# Patient Record
Sex: Male | Born: 1975 | Race: White | Hispanic: Yes | Smoking: Former smoker
Health system: Southern US, Community
[De-identification: ages and names within clinical notes are randomized; demographics above are authoritative.]

## PROBLEM LIST (undated history)

## (undated) DIAGNOSIS — J45909 Unspecified asthma, uncomplicated: Secondary | ICD-10-CM

## (undated) HISTORY — PX: CHOLECYSTECTOMY: SHX55

---

## 1999-12-12 ENCOUNTER — Emergency Department (HOSPITAL_COMMUNITY): Admission: EM | Admit: 1999-12-12 | Discharge: 1999-12-12 | Payer: Self-pay | Admitting: Emergency Medicine

## 1999-12-12 ENCOUNTER — Encounter: Payer: Self-pay | Admitting: Emergency Medicine

## 2004-01-26 ENCOUNTER — Emergency Department (HOSPITAL_COMMUNITY): Admission: EM | Admit: 2004-01-26 | Discharge: 2004-01-26 | Payer: Self-pay | Admitting: Family Medicine

## 2004-09-23 ENCOUNTER — Emergency Department (HOSPITAL_COMMUNITY): Admission: EM | Admit: 2004-09-23 | Discharge: 2004-09-23 | Payer: Self-pay | Admitting: Family Medicine

## 2005-03-07 ENCOUNTER — Emergency Department (HOSPITAL_COMMUNITY): Admission: EM | Admit: 2005-03-07 | Discharge: 2005-03-07 | Payer: Self-pay | Admitting: Family Medicine

## 2005-10-08 ENCOUNTER — Emergency Department (HOSPITAL_COMMUNITY): Admission: EM | Admit: 2005-10-08 | Discharge: 2005-10-08 | Payer: Self-pay | Admitting: Emergency Medicine

## 2005-11-17 ENCOUNTER — Encounter (INDEPENDENT_AMBULATORY_CARE_PROVIDER_SITE_OTHER): Payer: Self-pay | Admitting: *Deleted

## 2005-11-17 ENCOUNTER — Inpatient Hospital Stay (HOSPITAL_COMMUNITY): Admission: EM | Admit: 2005-11-17 | Discharge: 2005-11-19 | Payer: Self-pay | Admitting: Emergency Medicine

## 2005-11-21 ENCOUNTER — Ambulatory Visit: Payer: Self-pay | Admitting: Nurse Practitioner

## 2005-11-22 ENCOUNTER — Encounter: Payer: Self-pay | Admitting: Emergency Medicine

## 2005-11-23 ENCOUNTER — Inpatient Hospital Stay (HOSPITAL_COMMUNITY): Admission: AD | Admit: 2005-11-23 | Discharge: 2005-11-25 | Payer: Self-pay

## 2005-12-09 ENCOUNTER — Inpatient Hospital Stay (HOSPITAL_COMMUNITY): Admission: RE | Admit: 2005-12-09 | Discharge: 2005-12-12 | Payer: Self-pay

## 2006-04-15 ENCOUNTER — Ambulatory Visit: Payer: Self-pay | Admitting: Nurse Practitioner

## 2006-04-16 ENCOUNTER — Ambulatory Visit: Payer: Self-pay | Admitting: *Deleted

## 2006-10-17 ENCOUNTER — Ambulatory Visit: Payer: Self-pay | Admitting: Family Medicine

## 2006-11-12 ENCOUNTER — Encounter (INDEPENDENT_AMBULATORY_CARE_PROVIDER_SITE_OTHER): Payer: Self-pay | Admitting: *Deleted

## 2007-02-16 ENCOUNTER — Emergency Department (HOSPITAL_COMMUNITY): Admission: EM | Admit: 2007-02-16 | Discharge: 2007-02-16 | Payer: Self-pay | Admitting: Emergency Medicine

## 2007-03-20 ENCOUNTER — Ambulatory Visit: Payer: Self-pay | Admitting: Internal Medicine

## 2007-03-21 ENCOUNTER — Ambulatory Visit (HOSPITAL_COMMUNITY): Admission: RE | Admit: 2007-03-21 | Discharge: 2007-03-21 | Payer: Self-pay | Admitting: Nurse Practitioner

## 2007-03-23 ENCOUNTER — Encounter (INDEPENDENT_AMBULATORY_CARE_PROVIDER_SITE_OTHER): Payer: Self-pay | Admitting: Internal Medicine

## 2007-03-23 ENCOUNTER — Ambulatory Visit: Payer: Self-pay | Admitting: Family Medicine

## 2007-03-23 LAB — CONVERTED CEMR LAB
AST: 27 units/L (ref 0–37)
Alkaline Phosphatase: 82 units/L (ref 39–117)
Amphetamine Screen, Ur: NEGATIVE
BUN: 14 mg/dL (ref 6–23)
Basophils Absolute: 0 10*3/uL (ref 0.0–0.1)
Basophils Relative: 1 % (ref 0–1)
Benzodiazepines.: NEGATIVE
Cocaine Metabolites: NEGATIVE
Creatinine, Ser: 0.79 mg/dL (ref 0.40–1.50)
Eosinophils Relative: 2 % (ref 0–5)
HCT: 46.4 % (ref 39.0–52.0)
Hemoglobin: 16 g/dL (ref 13.0–17.0)
MCHC: 34.5 g/dL (ref 30.0–36.0)
Methadone: NEGATIVE
Monocytes Absolute: 0.5 10*3/uL (ref 0.1–1.0)
Phencyclidine (PCP): NEGATIVE
Platelets: 304 10*3/uL (ref 150–400)
RDW: 12.8 % (ref 11.5–15.5)
Sed Rate: 2 mm/hr (ref 0–16)
Total Bilirubin: 0.7 mg/dL (ref 0.3–1.2)

## 2007-04-17 ENCOUNTER — Ambulatory Visit: Payer: Self-pay | Admitting: Internal Medicine

## 2007-04-20 ENCOUNTER — Ambulatory Visit (HOSPITAL_COMMUNITY): Admission: RE | Admit: 2007-04-20 | Discharge: 2007-04-20 | Payer: Self-pay | Admitting: Family Medicine

## 2007-05-19 ENCOUNTER — Encounter: Admission: RE | Admit: 2007-05-19 | Discharge: 2007-06-03 | Payer: Self-pay | Admitting: Family Medicine

## 2007-07-02 ENCOUNTER — Emergency Department (HOSPITAL_COMMUNITY): Admission: EM | Admit: 2007-07-02 | Discharge: 2007-07-02 | Payer: Self-pay | Admitting: Emergency Medicine

## 2007-07-15 ENCOUNTER — Ambulatory Visit: Payer: Self-pay | Admitting: Internal Medicine

## 2007-07-15 ENCOUNTER — Ambulatory Visit (HOSPITAL_COMMUNITY): Admission: RE | Admit: 2007-07-15 | Discharge: 2007-07-15 | Payer: Self-pay | Admitting: Family Medicine

## 2007-07-23 ENCOUNTER — Ambulatory Visit: Payer: Self-pay | Admitting: Internal Medicine

## 2007-08-04 IMAGING — CT CT ABDOMEN W/ CM
1 of 3 series · 14 of 32 positions shown, 19 images · IV contrast (omnipaque)
Comparison: none

CLINICAL DATA: 30 year-old male status post cholecystectomy 11/18/05 with recurrent right upper quadrant pain, fever, and vomiting.  
ABDOMEN CT WITH CONTRAST:
TECHNIQUE: Multidetector CT imaging of the abdomen was performed following the standard protocol during bolus administration of intravenous contrast.
Contrast:  125 cc Omnipaque 300
TECHNIQUE: Multidetector CT imaging of the pelvis was performed following the standard protocol during bolus administration of intravenous contrast.
No acute fluid collection, adenopathy, or bowel abnormality.  Small amount of dependent free fluid is present, image # 88.

[Series 2: abd_pel 5.0 b40f st · axial · 0.72mm/px · z∈[+694,+1174]mm · 14 of 108 slices shown, 19 images]
[im 6/108  soft-tissue]
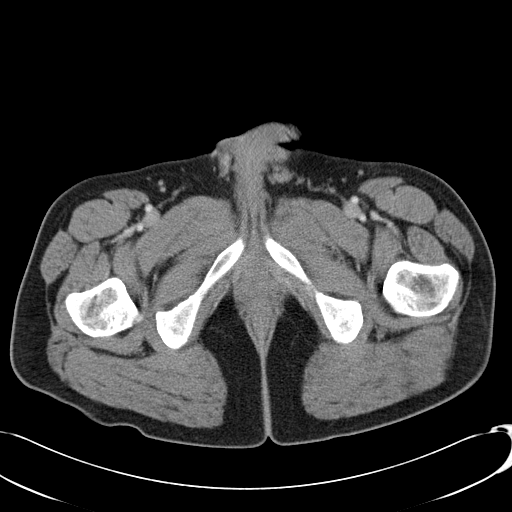
[im 6/108  bone]
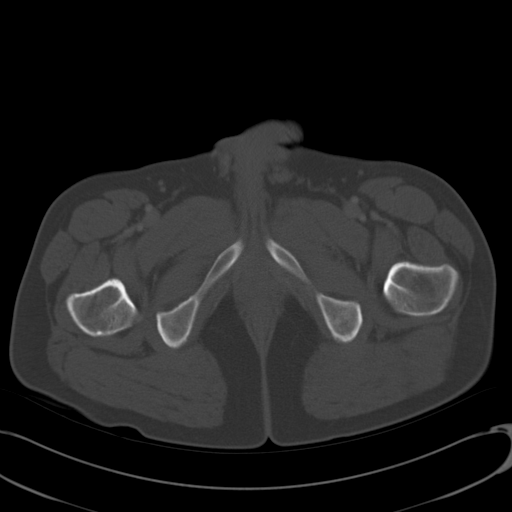
[im 17/108  soft-tissue]
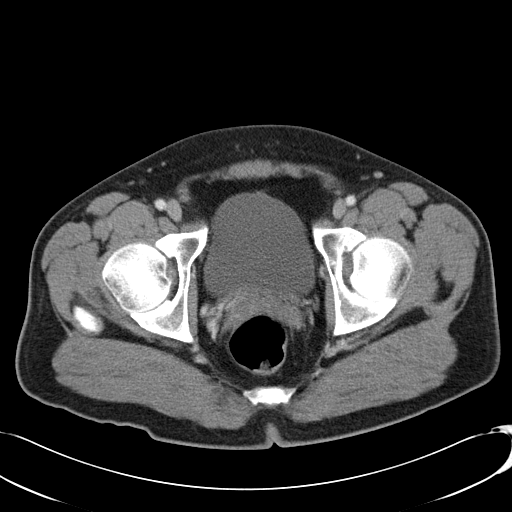
[im 22/108  soft-tissue]
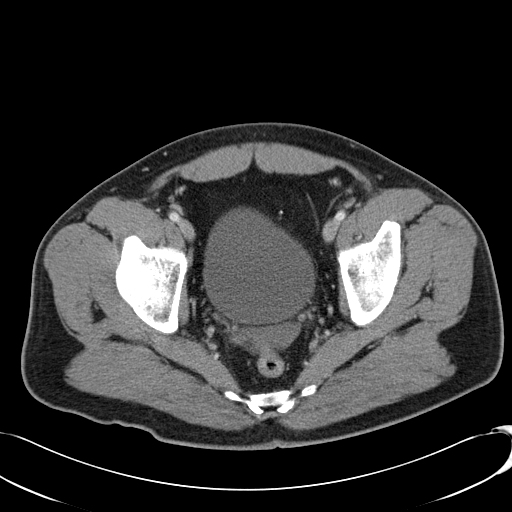
[im 33/108  soft-tissue]
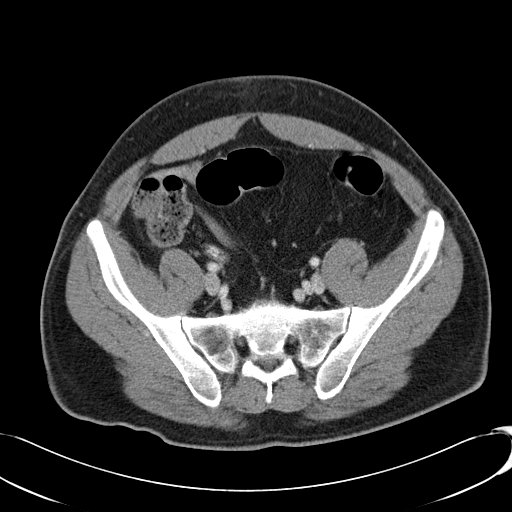
[im 38/108  soft-tissue]
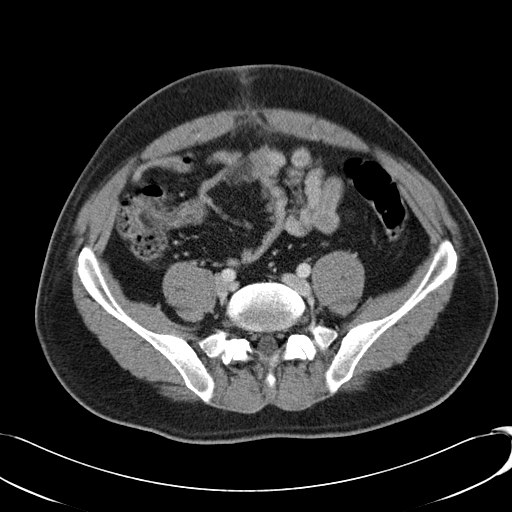
[im 49/108  soft-tissue]
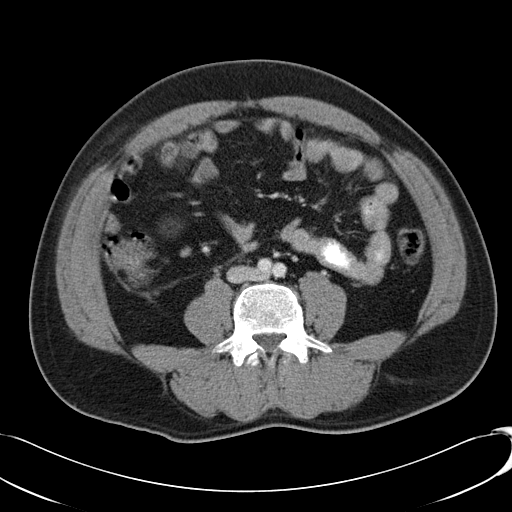
[im 54/108  soft-tissue]
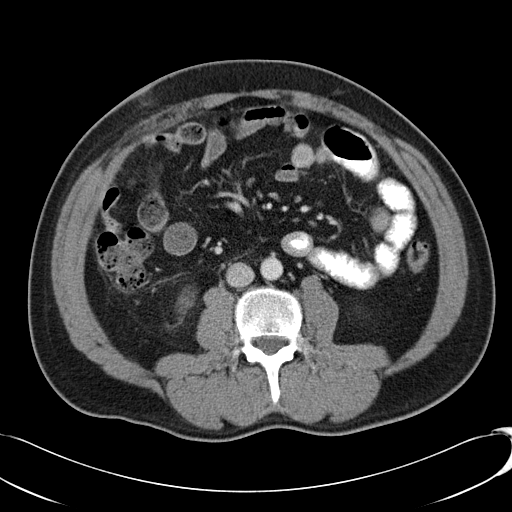
[im 59/108  soft-tissue]
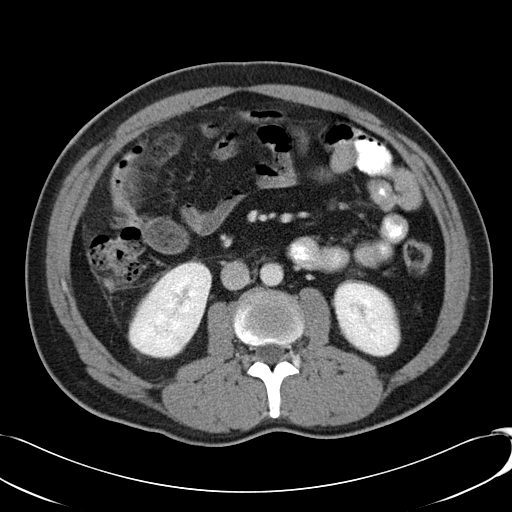
[im 70/108  soft-tissue]
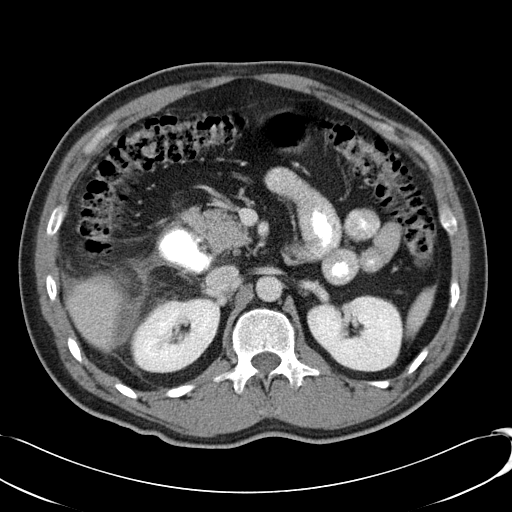
[im 70/108  bone]
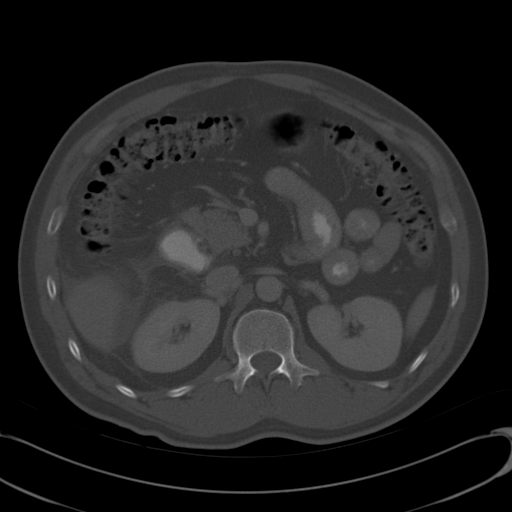
[im 75/108  soft-tissue]
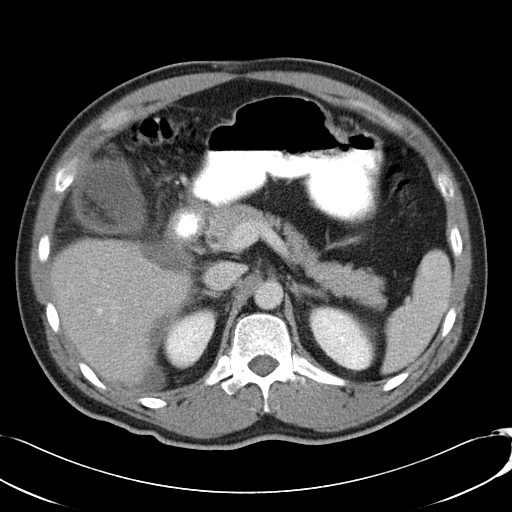
[im 86/108  soft-tissue]
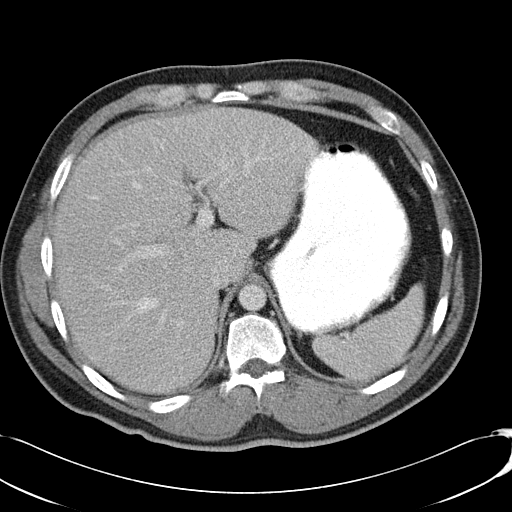
[im 86/108  lung]
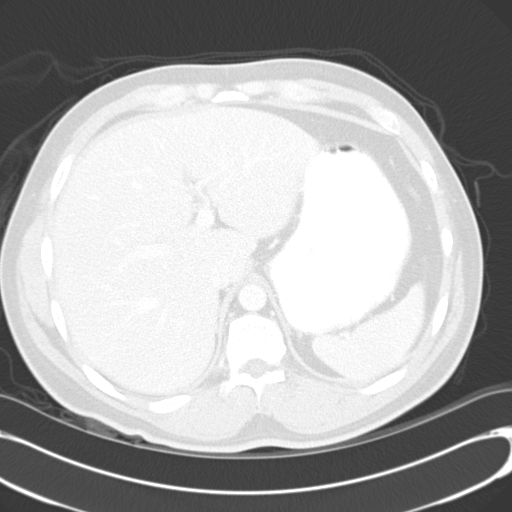
[im 91/108  soft-tissue]
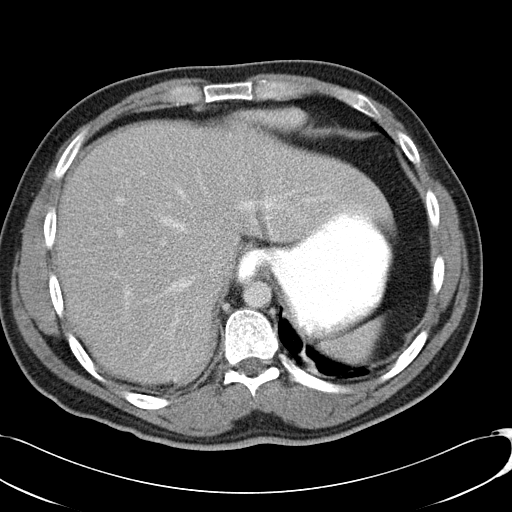
[im 91/108  lung]
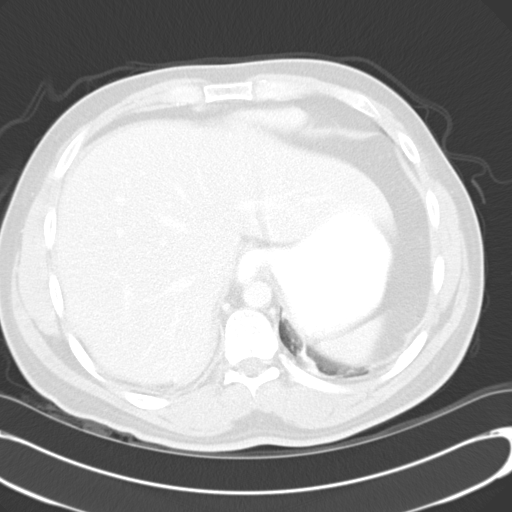
[im 97/108  lung]
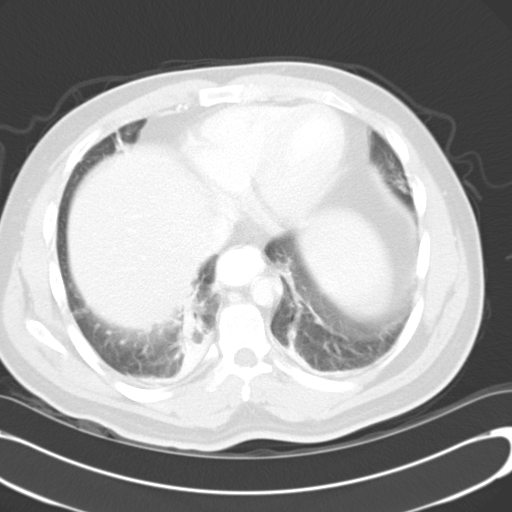
[im 102/108  soft-tissue]
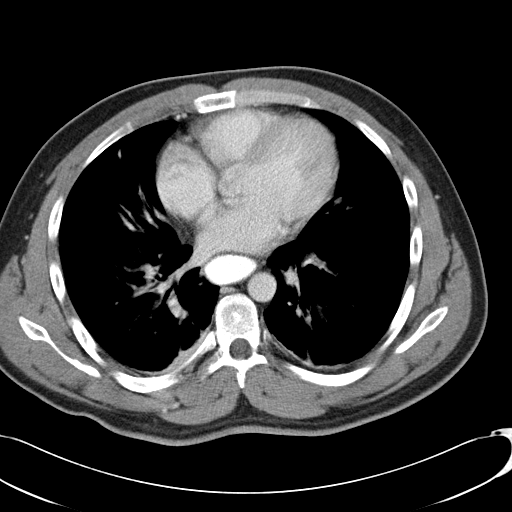
[im 102/108  lung]
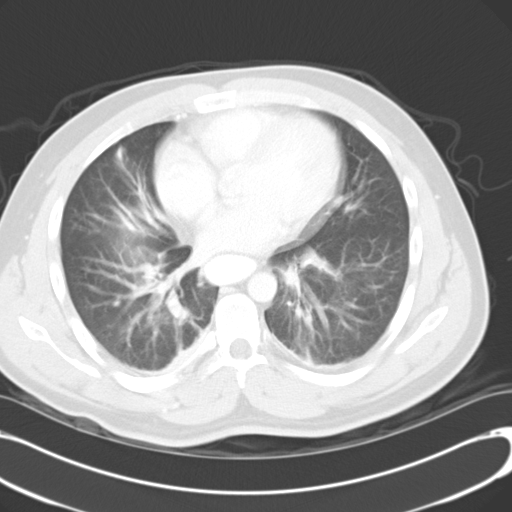

[14 of 32 positions shown; findings below may reference images not displayed]

FINDINGS: Bibasilar atelectasis is evident, worse on the right.  No pleural or pericardial fluid.  Contrast is present within a distended distal esophagus with an air-fluid level suspicious for esophageal dysmotility.
Within the abdomen, postoperative changes are present from a cholecystectomy.  In the gallbladder fossa, there is an irregular fluid collection with foci of gas and peripheral enhancement.  Surrounding inflammation is evident.  This is concerning for abscess formation or biloma.  The gallbladder fossa collection measures 6.3 x 8.0cm.  A small amount of perihepatic free fluid extends along the inferior dependent portion of the liver.  Inflammation and thickening is evident of the adjacent duodenum.  No biliary dilatation within the liver.  The liver, kidneys, adrenal glands, spleen, and pancreas are normal.  No bowel obstruction, dilatation, free air, or adenopathy.
IMPRESSION: 1.  6 x 8cm gallbladder fossa air-fluid collection consistent with postoperative abscess or biloma.  
2.  No biliary dilatation.  
3.  Small amount of inferior dependent perihepatic free fluid.
PELVIS CT WITH CONTRAST:
IMPRESSION: Trace pelvic free fluid.  No other acute pelvic finding.

## 2007-08-07 ENCOUNTER — Ambulatory Visit: Payer: Self-pay | Admitting: Internal Medicine

## 2008-04-07 ENCOUNTER — Ambulatory Visit: Payer: Self-pay | Admitting: Internal Medicine

## 2008-04-15 ENCOUNTER — Encounter (INDEPENDENT_AMBULATORY_CARE_PROVIDER_SITE_OTHER): Payer: Self-pay | Admitting: Internal Medicine

## 2008-04-15 ENCOUNTER — Ambulatory Visit: Payer: Self-pay | Admitting: Internal Medicine

## 2008-04-15 LAB — CONVERTED CEMR LAB
Albumin: 4.8 g/dL (ref 3.5–5.2)
Alkaline Phosphatase: 74 units/L (ref 39–117)
BUN: 17 mg/dL (ref 6–23)
Calcium: 10.1 mg/dL (ref 8.4–10.5)
Chloride: 108 meq/L (ref 96–112)
Eosinophils Absolute: 0.4 10*3/uL (ref 0.0–0.7)
Glucose, Bld: 107 mg/dL — ABNORMAL HIGH (ref 70–99)
HCT: 45.4 % (ref 39.0–52.0)
Hemoglobin: 15.9 g/dL (ref 13.0–17.0)
Lymphs Abs: 2.6 10*3/uL (ref 0.7–4.0)
MCV: 93.8 fL (ref 78.0–100.0)
Monocytes Absolute: 0.7 10*3/uL (ref 0.1–1.0)
Monocytes Relative: 8 % (ref 3–12)
Neutrophils Relative %: 55 % (ref 43–77)
Potassium: 4.2 meq/L (ref 3.5–5.3)
RBC: 4.84 M/uL (ref 4.22–5.81)
WBC: 8.4 10*3/uL (ref 4.0–10.5)

## 2008-04-16 ENCOUNTER — Encounter (INDEPENDENT_AMBULATORY_CARE_PROVIDER_SITE_OTHER): Payer: Self-pay | Admitting: Internal Medicine

## 2008-05-12 ENCOUNTER — Ambulatory Visit: Payer: Self-pay | Admitting: Internal Medicine

## 2008-05-12 ENCOUNTER — Encounter (INDEPENDENT_AMBULATORY_CARE_PROVIDER_SITE_OTHER): Payer: Self-pay | Admitting: Internal Medicine

## 2008-05-12 LAB — CONVERTED CEMR LAB
Barbiturate Quant, Ur: NEGATIVE
Benzodiazepines.: NEGATIVE
Creatinine,U: 216.8 mg/dL
GC Probe Amp, Urine: NEGATIVE
Opiate Screen, Urine: NEGATIVE

## 2008-06-20 ENCOUNTER — Ambulatory Visit: Payer: Self-pay | Admitting: Family Medicine

## 2010-07-13 NOTE — Op Note (Signed)
[name], [name]                ACCOUNT NO.:  [id number]   MEDICAL RECORD NO.:  [id number]          PATIENT TYPE:  INP   LOCATION:  1608                         FACILITY:  [facility]   PHYSICIAN:  Lebron Conners, M.D.   DATE OF BIRTH:  16-May-1975   DATE OF PROCEDURE:  11/18/2005  DATE OF DISCHARGE:                                 OPERATIVE REPORT   PREOPERATIVE DIAGNOSES:  Cholelithiasis and acute cholecystitis.   POSTOPERATIVE DIAGNOSES:  Cholelithiasis and acute cholecystitis.   OPERATION:  Laparoscopic cholecystectomy.   SURGEON:  Lebron Conners, M.D.   ANESTHESIA:  General and local.   COMPLICATIONS:  None.   BLOOD LOSS:  About 100 mL.   SPECIMEN:  Gallbladder.   PROCEDURE:  After the patient was monitored and asleep and had routine  preparation and draping of the abdomen, I made a 3 cm transverse incision  just below the umbilicus where I had infiltrated local anesthetic.  I opened  the fascia longitudinally for about 2 cm and bluntly opened the peritoneal  cavity and then put in a 0 Vicryl pursestring suture in the fascia and  secured a Hasson cannula.  After inflation of the abdomen with carbon  dioxide, I put in 3 additional ports through anesthetized sites and pulled  down an adhesion of the omentum to the undersurface of the gallbladder and  saw that the gallbladder was acutely inflamed, thick-walled and distended.  I decompressed it with a suction aspirator and then grasped the fundus and  elevated it toward the right shoulder.  I worked with the patient positioned  head up, foot down, and tilted to the left.  A 30-degree viewing scope was  necessary in order to see the infundibulum.  I was able to identify the  infundibulum of the gallbladder and grasp it and pull it laterally.  The  hepatoduodenal ligament was quite fatty and quite inflamed, and there was a  large stone in the infundibulum making retraction and dissection somewhat  difficult.  However, after  persistent dissection I was able to identify the  cystic duct as it emerged from the gallbladder, and I clipped it with 4  clips and cut between the two which were closest to the gallbladder.  I then  further dissected the hepatoduodenal ligament and noted the cystic artery  branching onto the gallbladder, and I clipped and divided that as well.  I  then used the cautery to dissect the gallbladder out of the liver, gaining  hemostasis with the cautery as I did the dissection.  As I dissected the  gallbladder, it tore at the site of the grasper and a large stone came out.  I retrieved the stone and placed it in a plastic pouch and reserved it above  the liver.  After detaching the gallbladder from the liver, I placed it in a  plastic pouch and placed the plastic pouch containing the stone in there as  well.  I then copiously irrigated the right upper quadrant and removed stone  fragments and bile and blood and then carefully got good hemostasis in the  gallbladder  fossa.  Even though I felt hemostasis was good, I packed the  gallbladder fossa with Surgicel.  I removed the gallbladder from the abdomen  through the umbilical incision and tied the pursestring suture.  That gave  adequate closure of the umbilical incision.  I removed the remaining irrigant from the gutter lateral to the liver and  then removed the two lateral ports under direct view.  I removed the  epigastric port after allowing the carbon dioxide to escape, and I then  closed all skin incisions with intracuticular 4-0 Vicryl and Steri-Strips.  The patient tolerated the operation well.      Lebron Conners, M.D.  Electronically Signed     WB/MEDQ  D:  11/18/2005  T:  11/20/2005  Job:  [id number]

## 2010-07-13 NOTE — Discharge Summary (Signed)
NAMEGRIFFIN, [name]                ACCOUNT NO.:  [id number]   MEDICAL RECORD NO.:  [id number]          PATIENT TYPE:  INP   LOCATION:  5731                         FACILITY:  [facility]   PHYSICIAN:  Lebron Conners, M.D.   DATE OF BIRTH:  Dec 05, 1975   DATE OF ADMISSION:  12/09/2005  DATE OF DISCHARGE:  12/12/2005                                 DISCHARGE SUMMARY   HISTORY:  This man had laparoscopic cholecystectomy a short while back, and  it has been complicated by an abdominal abscess.  He is admitted to the  hospital on December 09, 2005, with plans for IV antibiotics and percutaneous  drainage.  The abscess is shown clearly by imaging.   HOSPITAL COURSE:  On December 10, 2005, he underwent right upper quadrant  abscess drainage with replacement of the existing catheter.  He did well.  His white count was normal, and his temperature remained normal.  On October  18, he felt much better and was eating well.  White count was normal.  Abdomen soft.  Cultures growing staph aureus with unreported sensitivities.  He was sent home on Keflex since he had responded to IV Ancef.  We would  plan to follow up the culture and see him back in the office in a few days.   DIAGNOSIS:  Postoperative abdominal abscess.   OPERATION:  Percutaneous drainage of abscess.   DISCHARGE CONDITION:  Improved.      Lebron Conners, M.D.  Electronically Signed     WB/MEDQ  D:  01/15/2006  T:  01/15/2006  Job:  [id number]

## 2010-07-13 NOTE — H&P (Signed)
NAMEJACEK, [name]                ACCOUNT NO.:  [id number]   MEDICAL RECORD NO.:  [id number]          PATIENT TYPE:  INP   LOCATION:  5734                         FACILITY:  [facility]   PHYSICIAN:  Thomas A. Cornett, M.D.DATE OF BIRTH:  13-Aug-1975   DATE OF ADMISSION:  11/23/2005  DATE OF DISCHARGE:                                HISTORY & PHYSICAL   CHIEF COMPLAINT:  Right upper quadrant pain status post laparoscopic  cholecystectomy.   HISTORY OF PRESENT ILLNESS:  The patient is a 35 year old male who is now 3  days status post laparoscopic cholecystectomy bus Dr. Lebron Conners for  acute cholecystitis and cholelithiasis.  He presents with a 1-day history of  right upper quadrant pain, nausea, vomiting, fever and chills.  He was  brought to the emergency room today for evaluation.  CT scan revealed an  abscess in his right upper quadrant and the gallbladder fossa.  I was asked  to see him for this.  He is complaining of mild to moderate pain in his  right upper quadrant with nausea, vomiting, and anorexia.  Denies any back  pain or shortness of breath.   PAST MEDICAL HISTORY:  As above.   PAST SURGICAL HISTORY:  As above.   FAMILY HISTORY:  Noncontributory.   SOCIAL HISTORY:  Positive for tobacco use.  No alcohol use.   ALLERGIES:  NO KNOWN DRUG ALLERGIES.   MEDICATIONS:  1. Percocet.  2. Cipro.   REVIEW OF SYSTEMS:  Otherwise negative.   PHYSICAL EXAMINATION:  VITAL SIGNS:  Temperature is 98.3, pulse 89, blood  pressure 142/80.  GENERAL APPEARANCE:  Hispanic male in no apparent stress distress.  HEENT:  No scleral icterus.  Oropharynx clear.  NECK:  Supple, nontender.  CHEST:  Clear to auscultation.  CARDIOVASCULAR:  Regular rate and rhythm.  ABDOMEN:  Tender in the right upper quadrant with rebound and guarding  noted.  Incisions are clean, dry, intact.  EXTREMITIES:  No edema.   DIAGNOSTIC STUDIES:  CT scan reveals an abscess and gallbladder fossa with  air and  fluid measuring 7 cm in maximal diameter.  He has a white count of  13,400 with a left shift.  Hemoglobin is 15.  Electrolytes are within normal  limits except for a glucose of 130.  His liver functions show an alkaline  phosphatase of 194, total bilirubin 0.9, AST and ALT 79 and 141.   IMPRESSION:  Postoperative abscess, gallbladder fossa, status post  laparoscopic cholecystectomy.   PLAN:  Will admit for IV hydration, IV antibiotics and consult  interventional radiology in the morning for percutaneous drain placement.      Thomas A. Cornett, M.D.  Electronically Signed     TAC/MEDQ  D:  11/23/2005  T:  11/24/2005  Job:  [id number]

## 2017-03-31 ENCOUNTER — Emergency Department (HOSPITAL_COMMUNITY)
Admission: EM | Admit: 2017-03-31 | Discharge: 2017-03-31 | Disposition: A | Discharge status: Home / Self Care | Payer: Self-pay | Attending: Emergency Medicine | Admitting: Emergency Medicine

## 2017-03-31 ENCOUNTER — Encounter (HOSPITAL_COMMUNITY): Payer: Self-pay | Admitting: Emergency Medicine

## 2017-03-31 DIAGNOSIS — F1920 Other psychoactive substance dependence, uncomplicated: Secondary | ICD-10-CM | POA: Insufficient documentation

## 2017-03-31 DIAGNOSIS — F19951 Other psychoactive substance use, unspecified with psychoactive substance-induced psychotic disorder with hallucinations: Secondary | ICD-10-CM | POA: Insufficient documentation

## 2017-03-31 DIAGNOSIS — L989 Disorder of the skin and subcutaneous tissue, unspecified: Secondary | ICD-10-CM | POA: Insufficient documentation

## 2017-03-31 LAB — RAPID URINE DRUG SCREEN, HOSP PERFORMED
AMPHETAMINES: POSITIVE — AB
BARBITURATES: NOT DETECTED
Benzodiazepines: NOT DETECTED
COCAINE: POSITIVE — AB
Opiates: NOT DETECTED
TETRAHYDROCANNABINOL: NOT DETECTED

## 2017-03-31 MED ORDER — LORAZEPAM 1 MG PO TABS
1.0000 mg | ORAL_TABLET | Freq: Once | ORAL | Status: AC
  Administered 2017-03-31: 1 mg via ORAL
  Filled 2017-03-31: qty 1

## 2017-03-31 NOTE — ED Notes (Signed)
ED Provider at bedside. 

## 2017-03-31 NOTE — ED Provider Notes (Signed)
[facility]   CSN: [id number] Arrival date & time: 03/31/17  0710     History   Chief Complaint Chief Complaint  Patient presents with  . Pruritis  . Insect Bite    HPI [name] is a 42 y.o. male.  HPI [name] is a 42 y.o. male presents to emergency department complaining of multiple insect bites.  Patient states that he sees white bugs crawling all over his skin and states he has been scratching to blood.  He has seen a dermatologist and was started on triamcinolone cream, Allegra, and moisturizing cream.  He states he has been using his medications with no relief of her symptoms.  Patient believes that white bugs are crawling all over him.  He has been picking at them with scissors.  He does admit to drug use but refuses to tell me what he used.  History reviewed. No pertinent past medical history.  There are no active problems to display for this patient.   History reviewed. No pertinent surgical history.     Home Medications    Prior to Admission medications   Not on File    Family History No family history on file.  Social History Social History   Tobacco Use  . Smoking status: Not on file  Substance Use Topics  . Alcohol use: Yes  . Drug use: Not on file     Allergies   Patient has no known allergies.   Review of Systems Review of Systems  Skin: Positive for color change and wound.  Neurological: Negative for dizziness, weakness, light-headedness and headaches.  Psychiatric/Behavioral: Positive for hallucinations.  All other systems reviewed and are negative.    Physical Exam Updated Vital Signs BP (!) 141/104 (BP Location: Right Arm) Comment: Pt is moving.  He is responding to back pain.  RN awwre  Pulse (!) 115   Temp 98 F (36.7 C) (Oral)   Resp (!) 24   Ht 5\' 6"  (1.676 m)   Wt 86.2 kg (190 lb) Comment: Simultaneous filing. User may not have seen previous data.   SpO2 98%   BMI 30.67 kg/m   Physical Exam  Constitutional: He appears well-developed and well-nourished. No distress.  Eyes: Conjunctivae are normal.  Neck: Neck supple.  Cardiovascular: Normal rate.  Pulmonary/Chest: No respiratory distress.  Abdominal: He exhibits no distension.  Skin:  Numerous sores and lesions to the head, torso, arms, legs.  Patient is actively scratching his body.  Psychiatric:  Hallucinating and delusional, pointing at dark specks saying it is bugs.  Cutting through his skin with scissors.  Tearful.  Nursing note and vitals reviewed.    ED Treatments / Results  Labs (all labs ordered are listed, but only abnormal results are displayed) Labs Reviewed  RAPID URINE DRUG SCREEN, HOSP PERFORMED - Abnormal; Notable for the following components:      Result Value   Cocaine POSITIVE (*)    Amphetamines POSITIVE (*)    All other components within normal limits    EKG  EKG Interpretation None       Radiology No results found.  Procedures Procedures (including critical care time)  Medications Ordered in ED Medications  LORazepam (ATIVAN) tablet 1 mg (not administered)     Initial Impression / Assessment and Plan / ED Course  I have reviewed the triage vital signs and the nursing notes.  Pertinent labs & imaging results that were available during my care of  the patient were reviewed by me and considered in my medical decision making (see chart for details).     Patient in emergency department with concern of bugs crawling through his body.  He has multiple sores all over his torso, extremities, face.  He is picking at the source and even has a pair of scissors that he is taking into his skin trying to get "bugs" out.  He is tearful.  While in the room, he completely undressed himself and started hitting his closed with a shoe.  I do not see any bugs, he is pointing at dirt specks.  Question psychosis versus drug use.  Will get medical clearance  labs and get TTS to assess.  Ativan ordered.   9:44 AM Pt is much calmer now. He is wanting to go home. I reassessed him. Explained to him that his lesions are most likely a result of meth use and crawling and bug sensation from cocaine. Instructed to stop using drugs. Continue creams given by dermatologist. Pt denies SI or HI. Stable for dc home.   Vitals:   03/31/17 0714  BP: (!) 141/104  Pulse: (!) 115  Resp: (!) 24  Temp: 98 F (36.7 C)  TempSrc: Oral  SpO2: 98%  Weight: 86.2 kg (190 lb)  Height: 5\' 6"  (1.676 m)    Final Clinical Impressions(s) / ED Diagnoses   Final diagnoses:  Drug-induced psychotic disorder with hallucinations (HCC)  Polysubstance (excluding opioids) dependence [facility])  Skin lesions    ED Discharge Orders    None       Jaynie Crumble, PA-C 03/31/17 1542    Cathren Laine, MD 03/31/17 [phone]

## 2017-03-31 NOTE — ED Triage Notes (Signed)
[facility] [place] [facility] [place] it[place] from bug bites. Patient kept showing a video to EMS of where he got bit by bug around 2-3am today. Patient has medical records with him in his book bag. Vitals: 172 palpated. 88HR.

## 2017-03-31 NOTE — ED Notes (Signed)
Patient in room taking off clothes, picking at his skin and swatting clothes and shoes at "bugs". No bugs are seen by ED staff

## 2017-03-31 NOTE — ED Notes (Signed)
Bed: [facility]2 Expected date:  Expected time:  Means of arrival:  Comments: 

## 2017-03-31 NOTE — Discharge Instructions (Signed)
Please stop doing drugs. Follow up as needed.

## 2017-04-03 ENCOUNTER — Inpatient Hospital Stay (HOSPITAL_COMMUNITY)
Admission: RE | Admit: 2017-04-03 | Discharge: 2017-04-12 | DRG: 885 | Disposition: A | Discharge status: Home / Self Care | Payer: Federal, State, Local not specified - Other | Attending: Psychiatry | Admitting: Psychiatry

## 2017-04-03 DIAGNOSIS — Z9049 Acquired absence of other specified parts of digestive tract: Secondary | ICD-10-CM

## 2017-04-03 DIAGNOSIS — F22 Delusional disorders: Secondary | ICD-10-CM | POA: Diagnosis present

## 2017-04-03 DIAGNOSIS — G4700 Insomnia, unspecified: Secondary | ICD-10-CM | POA: Diagnosis present

## 2017-04-03 DIAGNOSIS — F333 Major depressive disorder, recurrent, severe with psychotic symptoms: Principal | ICD-10-CM | POA: Diagnosis present

## 2017-04-03 DIAGNOSIS — Z23 Encounter for immunization: Secondary | ICD-10-CM | POA: Diagnosis not present

## 2017-04-03 DIAGNOSIS — R45851 Suicidal ideations: Secondary | ICD-10-CM | POA: Diagnosis present

## 2017-04-03 DIAGNOSIS — F1490 Cocaine use, unspecified, uncomplicated: Secondary | ICD-10-CM | POA: Diagnosis not present

## 2017-04-03 DIAGNOSIS — Z811 Family history of alcohol abuse and dependence: Secondary | ICD-10-CM

## 2017-04-03 DIAGNOSIS — F1410 Cocaine abuse, uncomplicated: Secondary | ICD-10-CM | POA: Diagnosis present

## 2017-04-03 DIAGNOSIS — F1514 Other stimulant abuse with stimulant-induced mood disorder: Secondary | ICD-10-CM | POA: Diagnosis present

## 2017-04-03 DIAGNOSIS — F4310 Post-traumatic stress disorder, unspecified: Secondary | ICD-10-CM | POA: Diagnosis present

## 2017-04-03 DIAGNOSIS — F1990 Other psychoactive substance use, unspecified, uncomplicated: Secondary | ICD-10-CM | POA: Diagnosis present

## 2017-04-03 DIAGNOSIS — Z87891 Personal history of nicotine dependence: Secondary | ICD-10-CM | POA: Diagnosis not present

## 2017-04-03 DIAGNOSIS — R202 Paresthesia of skin: Secondary | ICD-10-CM | POA: Diagnosis not present

## 2017-04-03 DIAGNOSIS — R21 Rash and other nonspecific skin eruption: Secondary | ICD-10-CM | POA: Diagnosis present

## 2017-04-03 DIAGNOSIS — F1910 Other psychoactive substance abuse, uncomplicated: Secondary | ICD-10-CM | POA: Diagnosis not present

## 2017-04-03 DIAGNOSIS — Z79899 Other long term (current) drug therapy: Secondary | ICD-10-CM | POA: Diagnosis not present

## 2017-04-03 DIAGNOSIS — M549 Dorsalgia, unspecified: Secondary | ICD-10-CM | POA: Diagnosis not present

## 2017-04-03 HISTORY — DX: Unspecified asthma, uncomplicated: J45.909

## 2017-04-03 MED ORDER — TRAZODONE HCL 50 MG PO TABS
50.0000 mg | ORAL_TABLET | Freq: Every evening | ORAL | Status: DC | PRN
  Administered 2017-04-03 – 2017-04-11 (×11): 50 mg via ORAL
  Filled 2017-04-03 (×20): qty 1
  Filled 2017-04-03: qty 2
  Filled 2017-04-03 (×3): qty 1

## 2017-04-03 MED ORDER — LORATADINE 10 MG PO TABS
10.0000 mg | ORAL_TABLET | Freq: Every day | ORAL | Status: DC
  Administered 2017-04-03 – 2017-04-12 (×10): 10 mg via ORAL
  Filled 2017-04-03 (×14): qty 1

## 2017-04-03 MED ORDER — ALUM & MAG HYDROXIDE-SIMETH 200-200-20 MG/5ML PO SUSP: 30.0000 mL | ORAL | Status: DC | PRN

## 2017-04-03 MED ORDER — MAGNESIUM HYDROXIDE 400 MG/5ML PO SUSP: 30.0000 mL | Freq: Every day | ORAL | Status: DC | PRN

## 2017-04-03 MED ORDER — PERMETHRIN 5 % EX CREA
TOPICAL_CREAM | CUTANEOUS | Status: AC
  Administered 2017-04-03 – 2017-04-05 (×2): via TOPICAL
  Administered 2017-04-07: 1 via TOPICAL
  Filled 2017-04-03 (×3): qty 60

## 2017-04-03 MED ORDER — HYDROXYZINE HCL 25 MG PO TABS
25.0000 mg | ORAL_TABLET | Freq: Four times a day (QID) | ORAL | Status: DC | PRN
  Administered 2017-04-07 – 2017-04-09 (×5): 25 mg via ORAL
  Filled 2017-04-03 (×5): qty 1

## 2017-04-03 MED ORDER — TRIAMCINOLONE ACETONIDE 0.1 % EX CREA
TOPICAL_CREAM | Freq: Two times a day (BID) | CUTANEOUS | Status: DC
  Administered 2017-04-04 – 2017-04-06 (×4): via TOPICAL
  Administered 2017-04-07: 1 via TOPICAL
  Administered 2017-04-07 – 2017-04-11 (×6): via TOPICAL
  Filled 2017-04-03 (×5): qty 15

## 2017-04-03 MED ORDER — IVERMECTIN 3 MG PO TABS
15.0000 mg | ORAL_TABLET | Freq: Once | ORAL | Status: AC
  Administered 2017-04-03: 15 mg via ORAL
  Filled 2017-04-03: qty 5

## 2017-04-03 MED ORDER — ACETAMINOPHEN 325 MG PO TABS
650.0000 mg | ORAL_TABLET | Freq: Four times a day (QID) | ORAL | Status: DC | PRN
  Administered 2017-04-06 – 2017-04-09 (×6): 650 mg via ORAL
  Filled 2017-04-03 (×6): qty 2

## 2017-04-03 MED ORDER — TRIAMCINOLONE ACETONIDE 0.1 % EX CREA: TOPICAL_CREAM | Freq: Two times a day (BID) | CUTANEOUS | Status: DC
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
## 2017-04-03 NOTE — H&P (Signed)
Behavioral Health Medical Screening Exam  Jesus PittsLuis Franco Lorine [name] is an 42 y.o. male patient [facility]  Patient sent from his dermatologist office.  Patient has been to ED and dermatologist with complaints of having bugs under his skin and has been picking at them.  Patient states that he is able to see the bugs sometimes.  States that he has been to several doctors and no one believes him. Patient does endorse depression related to a recent break up with girlfriend.  Patient states that he has had thoughts about killing himself and burning his skin off related to the itching and no one believing him about the bugs and no one helping him.    Total Time spent with patient: 45 minutes  Psychiatric Specialty Exam: Physical Exam  Constitutional: He is oriented to person, place, and time.  Neck: Normal range of motion.  Respiratory: Effort normal.  Neurological: He is alert and oriented to person, place, and time.  Skin: Skin is warm and dry.  Patient has pick marks all over skin from head to toe.  Complaints of insects under skin     Review of Systems  Psychiatric/Behavioral: Positive for depression, hallucinations (Thinks he has bugs under his skin), substance abuse and suicidal ideas. The patient has insomnia.   All other systems reviewed and are negative.   There were no vitals taken for this visit.There is no height or weight on file to calculate BMI.  General Appearance: Casual  Eye Contact:  Good  Speech:  Clear and Coherent and Normal Rate  Volume:  Normal  Mood:  Anxious, Depressed, Hopeless and tearful  Affect:  Depressed and Tearful  Thought Process:  Goal Directed  Orientation:  Full (Time, Place, and Person)  Thought Content:  Delusions and Hallucinations: Tactile  Suicidal Thoughts:  Yes.  with intent/plan  Homicidal Thoughts:  No  Memory:  Immediate;   Good Recent;   Good Remote;   Good  Judgement:  Impaired  Insight:  Lacking  Psychomotor Activity:   Normal  Concentration: Concentration: Fair and Attention Span: Fair  Recall:  Good  Fund of Knowledge:Fair  Language: Fair  Akathisia:  No  Handed:  Right  AIMS (if indicated):     Assets:  Communication Skills Desire for Improvement Housing  Sleep:       Musculoskeletal: Strength & Muscle Tone: within normal limits Gait & Station: normal Patient leans: N/A  There were no vitals taken for this visit.  Recommendations:  Inpatient treatment recommended.  Based on my evaluation the patient does not appear to have an emergency medical condition.  Juwan Vences, NP 04/03/2017, 6:34 PM

## 2017-04-04 ENCOUNTER — Encounter (HOSPITAL_COMMUNITY): Payer: Self-pay

## 2017-04-04 ENCOUNTER — Other Ambulatory Visit: Payer: Self-pay

## 2017-04-04 DIAGNOSIS — R202 Paresthesia of skin: Secondary | ICD-10-CM

## 2017-04-04 DIAGNOSIS — F1510 Other stimulant abuse, uncomplicated: Secondary | ICD-10-CM

## 2017-04-04 DIAGNOSIS — F1410 Cocaine abuse, uncomplicated: Secondary | ICD-10-CM

## 2017-04-04 DIAGNOSIS — F333 Major depressive disorder, recurrent, severe with psychotic symptoms: Principal | ICD-10-CM

## 2017-04-04 DIAGNOSIS — R45851 Suicidal ideations: Secondary | ICD-10-CM

## 2017-04-04 DIAGNOSIS — M549 Dorsalgia, unspecified: Secondary | ICD-10-CM

## 2017-04-04 LAB — RAPID URINE DRUG SCREEN, HOSP PERFORMED
Amphetamines: NOT DETECTED
BENZODIAZEPINES: NOT DETECTED
Barbiturates: NOT DETECTED
COCAINE: NOT DETECTED
OPIATES: NOT DETECTED
Tetrahydrocannabinol: NOT DETECTED

## 2017-04-04 LAB — COMPREHENSIVE METABOLIC PANEL
ALBUMIN: 3.9 g/dL (ref 3.5–5.0)
ALK PHOS: 88 U/L (ref 38–126)
ALT: 17 U/L (ref 17–63)
ANION GAP: 6 (ref 5–15)
AST: 19 U/L (ref 15–41)
BILIRUBIN TOTAL: 0.2 mg/dL — AB (ref 0.3–1.2)
BUN: 12 mg/dL (ref 6–20)
CALCIUM: 9.9 mg/dL (ref 8.9–10.3)
CO2: 28 mmol/L (ref 22–32)
Chloride: 108 mmol/L (ref 101–111)
Creatinine, Ser: 0.86 mg/dL (ref 0.61–1.24)
GFR calc Af Amer: >60 mL/min (ref >60)
GFR calc non Af Amer: >60 mL/min (ref >60)
GLUCOSE: 147 mg/dL — AB (ref 65–99)
Potassium: 3.5 mmol/L (ref 3.5–5.1)
Sodium: 142 mmol/L (ref 135–145)
TOTAL PROTEIN: 6.6 g/dL (ref 6.5–8.1)

## 2017-04-04 LAB — LIPID PANEL
Cholesterol: 178 mg/dL (ref 0–200)
HDL: 50 mg/dL (ref >40)
LDL CALC: 92 mg/dL (ref 0–99)
Total CHOL/HDL Ratio: 3.6 RATIO
Triglycerides: 182 mg/dL — ABNORMAL HIGH (ref <150)
VLDL: 36 mg/dL (ref 0–40)

## 2017-04-04 LAB — CBC
HEMATOCRIT: 42.1 % (ref 39.0–52.0)
Hemoglobin: 14.6 g/dL (ref 13.0–17.0)
MCH: 32.5 pg (ref 26.0–34.0)
MCHC: 34.7 g/dL (ref 30.0–36.0)
MCV: 93.8 fL (ref 78.0–100.0)
Platelets: 329 10*3/uL (ref 150–400)
RBC: 4.49 MIL/uL (ref 4.22–5.81)
RDW: 12.9 % (ref 11.5–15.5)
WBC: 6.2 10*3/uL (ref 4.0–10.5)

## 2017-04-04 LAB — TSH: TSH: 1.878 u[IU]/mL (ref 0.350–4.500)

## 2017-04-04 LAB — HEMOGLOBIN A1C
HEMOGLOBIN A1C: 5.4 % (ref 4.8–5.6)
Mean Plasma Glucose: 108.28 mg/dL

## 2017-04-04 MED ORDER — OLANZAPINE 5 MG PO TABS
5.0000 mg | ORAL_TABLET | Freq: Every day | ORAL | Status: DC
  Administered 2017-04-04 – 2017-04-06 (×3): 5 mg via ORAL
  Filled 2017-04-04 (×4): qty 1

## 2017-04-04 MED ORDER — ALBUTEROL SULFATE HFA 108 (90 BASE) MCG/ACT IN AERS
1.0000 | INHALATION_SPRAY | Freq: Four times a day (QID) | RESPIRATORY_TRACT | Status: DC | PRN
  Administered 2017-04-06: 1 via RESPIRATORY_TRACT
  Administered 2017-04-06 – 2017-04-12 (×16): 2 via RESPIRATORY_TRACT
  Filled 2017-04-04: qty 6.7

## 2017-04-04 MED ORDER — OLANZAPINE 2.5 MG PO TABS
2.5000 mg | ORAL_TABLET | Freq: Every day | ORAL | Status: DC
  Administered 2017-04-04 – 2017-04-05 (×2): 2.5 mg via ORAL
  Filled 2017-04-04 (×5): qty 1

## 2017-04-04 MED ORDER — INFLUENZA VAC SPLIT QUAD 0.5 ML IM SUSY
0.5000 mL | PREFILLED_SYRINGE | INTRAMUSCULAR | Status: AC
  Administered 2017-04-05: 0.5 mL via INTRAMUSCULAR
  Filled 2017-04-04: qty 0.5

## 2017-04-04 MED ORDER — PNEUMOCOCCAL VAC POLYVALENT 25 MCG/0.5ML IJ INJ
0.5000 mL | INJECTION | INTRAMUSCULAR | Status: AC
  Administered 2017-04-05: 0.5 mL via INTRAMUSCULAR

## 2017-04-04 MED ORDER — SERTRALINE HCL 50 MG PO TABS
50.0000 mg | ORAL_TABLET | Freq: Every day | ORAL | Status: DC
  Administered 2017-04-04 – 2017-04-12 (×9): 50 mg via ORAL
  Filled 2017-04-04 (×13): qty 1

## 2017-04-04 NOTE — Progress Notes (Signed)
Recreation Therapy Notes  Date: 04/04/17 Time: 0930 Location: 300 Hall Dayroom  Group Topic: Stress Management  Goal Area(s) Addresses:  Patient will verbalize importance of using healthy stress management.  Patient will identify positive emotions associated with healthy stress management.   Behavioral Response: Engaged  Intervention: Stress Management  Activity :  Progressive Muscle Relaxation.  LRT introduced the stress management technique of progressive muscle relaxation.  Patients were to follow along as LRT read script to tense each muscle group and then relax them separately.   Education:  Stress Management, Discharge Planning.   Education Outcome: Acknowledges edcuation/In group clarification offered/Needs additional education  Clinical Observations/Feedback: Pt attended group.     Caroll RancherMarjette Gaetana Kawahara, LRT/CTRS         Caroll RancherLindsay, Amyah Clawson A 04/04/2017 12:03 PM

## 2017-04-04 NOTE — Progress Notes (Signed)
Data. Patient denies SI/HI/AVH. Verbally contracts for safety on the unit and to come to staff before acting of any self harm thoughts/feelings.  Patient interacting well with staff and other patients. Affect is flat and does not brighten. Patient had small wounds from the size of a pin dot to about a quarter from his scalp to his feet, including eyelids and anus and scrotum. Creme applied by patient. Patient reports the wounds are from, "Fleas." On his self assessment patient reports 8/10 for depression and hopelessness. His goal for today is, "Positive." Action. Emotional support and encouragement offered. Education provided on medication, indications and side effect. Q 15 minute checks done for safety. Response. Safety on the unit maintained through 15 minute checks.  Medications taken as prescribed. Attended groups. Remained calm and appropriate through out shift.

## 2017-04-04 NOTE — Progress Notes (Signed)
Adult Psychoeducational Group Note  Date:  04/04/2017 Time:  1600 Group Topic/Focus:  Relapse Prevention Planning:   The focus of this group is to define relapse and discuss the need for planning to combat relapse.  Participation Level:  Active  Participation Quality:  Appropriate  Affect:  Blunted  Cognitive:  Alert, Appropriate and Oriented  Insight: Improving  Engagement in Group:  Developing/Improving  Modes of Intervention:  Discussion, Education and Support  Additional Comments:  Pt. Remained quiet, but shared when prompted and stated his dog is a comfort for him and a good distraction from substance use.   [name], [name] 04/04/2017, 8:02 PM

## 2017-04-04 NOTE — [facility] Group Notes (Signed)
[facility] Group Notes:  (Nursing/MHT/Case Management/Adjunct)  Date:  04/04/2017  Time:  12:49 PM  Type of Therapy:  Nurse Education  Participation Level:  Active  Participation Quality:  Appropriate  Affect:  Appropriate  Cognitive:  Appropriate  Insight:  Appropriate  Engagement in Group:  Engaged  Modes of Intervention:  Activity, Discussion and Education  Summary of Progress/Problems:  Nurse educated patients to the uses of essential oils in the supplemental treatment of multiple medical issues.   Almira Barenny G Catalaya Garr 04/04/2017, 12:49 PM

## 2017-04-04 NOTE — [facility] Suicide Risk Assessment (Signed)
[facility] INPATIENT:  Family/Significant Other Suicide Prevention Education  Suicide Prevention Education:  Patient Refusal for Family/Significant Other Suicide Prevention Education: The patient [name] has refused to provide written consent for family/significant other to be provided Family/Significant Other Suicide Prevention Education during admission and/or prior to discharge.  Physician notified.  SPE completed with patient, as patient refused to consent to family contact. SPI pamphlet provided to pt and pt was encouraged to share information with support network, ask questions, and talk about any concerns relating to SPE. Patient denies access to guns/firearms and verbalized understanding of information provided. Mobile Crisis information also provided to patient.     [name] 04/04/2017, 11:29 AM

## 2017-04-04 NOTE — Progress Notes (Signed)
Patient presents with anxious/tangential/disorganized affect and behavior during admission interview and assessment. VS monitored and recorded. Skin check performed with Delicia MHT and revealed generalized scabies and open scabs. Contraband was not found. Patient was oriented to unit and schedule. Pt states "I am here to get rid of the itching. I don't do drugs or alcohol. I have bugs. My dog knows. He can sense it". Pt denies SI/HI/AVH at this time. PO fluids provided. Safety maintained. Rest encouraged.

## 2017-04-04 NOTE — Tx Team (Signed)
Interdisciplinary Treatment and Diagnostic Plan Update  04/04/2017 Time of Session: [place] MRN: [id number]  Principal Diagnosis: MDD, recurrent, severe, with psychotic features  Secondary Diagnoses: Active Problems:   Severe recurrent major depression with psychotic features (HCC)   Current Medications:  Current Facility-Administered Medications  Medication Dose Route Frequency Provider Last Rate Last Dose  . acetaminophen (TYLENOL) tablet 650 mg  650 mg Oral Q6H PRN Lindon Romp A, NP      . albuterol (PROVENTIL HFA;VENTOLIN HFA) 108 (90 Base) MCG/ACT inhaler 1-2 puff  1-2 puff Inhalation Q6H PRN Rozetta Nunnery, NP      . alum & mag hydroxide-simeth (MAALOX/MYLANTA) 200-200-20 MG/5ML suspension 30 mL  30 mL Oral Q4H PRN Lindon Romp A, NP      . hydrOXYzine (ATARAX/VISTARIL) tablet 25 mg  25 mg Oral Q6H PRN Rozetta Nunnery, NP      . Derrill Memo ON 04/05/2017] Influenza vac split quadrivalent PF (FLUARIX) injection 0.5 mL  0.5 mL Intramuscular Tomorrow-1000 Lindon Romp A, NP      . loratadine (CLARITIN) tablet 10 mg  10 mg Oral Daily Lindon Romp A, NP   10 mg at 04/04/17 0819  . magnesium hydroxide (MILK OF MAGNESIA) suspension 30 mL  30 mL Oral Daily PRN Lindon Romp A, NP      . permethrin (ELIMITE) 5 % cream   Topical QODAY Lindon Romp A, NP      . [START ON 04/05/2017] pneumococcal 23 valent vaccine (PNU-IMMUNE) injection 0.5 mL  0.5 mL Intramuscular Tomorrow-1000 Lindon Romp A, NP      . traZODone (DESYREL) tablet 50 mg  50 mg Oral QHS,MR X 1 Lindon Romp A, NP   50 mg at 04/03/17 2312  . triamcinolone cream (KENALOG) 0.1 %   Topical BID Lindon Romp A, NP       PTA Medications: No medications prior to admission.    Patient Stressors: Network engineer difficulties Health problems  Patient Strengths: Capable of independent living Facilities manager fund of knowledge  Treatment Modalities: Medication Management, Group therapy, Case management,   1 to 1 session with clinician, Psychoeducation, Recreational therapy.   Physician Treatment Plan for Primary Diagnosis: MDD, recurrent, severe, with psychotic features  Medication Management: Evaluate patient's response, side effects, and tolerance of medication regimen.  Therapeutic Interventions: 1 to 1 sessions, Unit Group sessions and Medication administration.  Evaluation of Outcomes: Not Met  Physician Treatment Plan for Secondary Diagnosis: Active Problems:   Severe recurrent major depression with psychotic features ([place])   Medication Management: Evaluate patient's response, side effects, and tolerance of medication regimen.  Therapeutic Interventions: 1 to 1 sessions, Unit Group sessions and Medication administration.  Evaluation of Outcomes: Not Met   RN Treatment Plan for Primary Diagnosis: MDD, recurrent, severe, with psychotic features Long Term Goal(s): Knowledge of disease and therapeutic regimen to maintain health will improve  Short Term Goals: Ability to remain free from injury will improve, Ability to participate in decision making will improve and Ability to identify and develop effective coping behaviors will improve  Medication Management: RN will administer medications as ordered by provider, will assess and evaluate patient's response and provide education to patient for prescribed medication. RN will report any adverse and/or side effects to prescribing provider.  Therapeutic Interventions: 1 on 1 counseling sessions, Psychoeducation, Medication administration, Evaluate responses to treatment, Monitor vital signs and CBGs as ordered, Perform/monitor CIWA, COWS, AIMS and Fall Risk screenings as ordered, Perform wound care treatments as  ordered.  Evaluation of Outcomes: Not Met   LCSW Treatment Plan for Primary Diagnosis: MDD, recurrent, severe, with psychotic features Long Term Goal(s): Safe transition to appropriate next level of care at discharge, Engage  patient in therapeutic group addressing interpersonal concerns.  Short Term Goals: Engage patient in aftercare planning with referrals and resources, Facilitate patient progression through stages of change regarding substance use diagnoses and concerns and Identify triggers associated with mental health/substance abuse issues  Therapeutic Interventions: Assess for all discharge needs, 1 to 1 time with Social worker, Explore available resources and support systems, Assess for adequacy in community support network, Educate family and significant other(s) on suicide prevention, Complete Psychosocial Assessment, Interpersonal group therapy.  Evaluation of Outcomes: Not Met   Progress in Treatment: Attending groups: No. Participating in groups: No.New to unit. Continuing to assess.  Taking medication as prescribed: Yes. Toleration medication: Yes. Family/Significant other contact made: No, will contact:  family member for SPE and collateral information if patient consents Patient understands diagnosis: Yes. Discussing patient identified problems/goals with staff: Yes. Medical problems stabilized or resolved: Yes. Denies suicidal/homicidal ideation: Yes. Issues/concerns per patient self-inventory: No. Other: n/a  New problem(s) identified: No, Describe:  n/a  New Short Term/Long Term Goal(s): detox, medication management for mood stabilization; elimination of SI thoughts; development of comprehensive mental wellness/sobriety plan.   Patient: "To get treatment to help me feel better."   Discharge Plan or Barriers: CSW assessing for appropriate referrals. No current mental health providers per pt.   Reason for Continuation of Hospitalization: Anxiety Depression Medication stabilization Suicidal ideation Withdrawal symptoms  Estimated Length of Stay: Tuesday, 04/08/17  Attendees: Patient: [name]  04/04/2017 9:29 AM  Physician: Dr. Parke Poisson MD; Dr. Nancy Fetter MD 04/04/2017 9:29 AM   Nursing: Chrys Racer RN; [place] RN 04/04/2017 9:29 AM  RN Care Manager: Lars Pinks CM 04/04/2017 9:29 AM  Social Worker: Press photographer, LCSW 04/04/2017 9:29 AM  Recreational Therapist: x 04/04/2017 9:29 AM  Other: Lindell Spar NP; Ricky Ala NP 04/04/2017 9:29 AM  Other:  04/04/2017 9:29 AM  Other: 04/04/2017 9:29 AM    Scribe for Treatment Team: [place], LCSW 04/04/2017 9:29 AM

## 2017-04-04 NOTE — [facility] Counselor (Signed)
Adult Comprehensive Assessment  Patient ID: Jesus BurowsLuis Franco [name], male   DOB: 06-Jun-1975, 42 y.o.   MRN: [id number]  Information Source: Information source: Patient  Current Stressors:  Educational / Learning stressors: Patient denies  Employment / Job issues: Patient reports he has not worked in the last two weeks due to his sitauation Family Relationships: Patient denies  Surveyor, quantityinancial / Lack of resources (include bankruptcy): Patient denies  Housing / Lack of housing: Patient denies  Physical health (include injuries & life threatening diseases): Patient reports being bitten by bugs.  Social relationships: Patient denies  Substance abuse: Patient denies. UDS postive for meth and cocaine  Bereavement / Loss: Patient denies   Living/Environment/Situation:  Living Arrangements: Alone(Boarding house room) Living conditions (as described by patient or guardian): "good" How long has patient lived in current situation?: 1 year What is atmosphere in current home: Comfortable, Temporary  Family History:  Marital status: Long term relationship(Patient is currently sepereated from his wife. ) Separated, when?: 17 years  Long term relationship, how long?: Patient reports being in a relationship with his girlfriend for 7 years  What types of issues is patient dealing with in the relationship?: Patient did not disclose  Additional relationship information: N/A  Are you sexually active?: Yes What is your sexual orientation?: Heterosexual  Has your sexual activity been affected by drugs, alcohol, medication, or emotional stress?: Patient denies  Does patient have children?: Yes How many children?: 2 How is patient's relationship with their children?: "Good"   Childhood History:  By whom was/is the patient raised?: Both parents, Mother Additional childhood history information: Both parents lives in [place]. Patient reports his father was an alcoholic.  Description of patient's relationship with  caregiver when they were a child: "Good"  Patient's description of current relationship with people who raised him/her: "Good"  How were you disciplined when you got in trouble as a child/adolescent?: Patient did not disclose  Does patient have siblings?: Yes Number of Siblings: 10 Description of patient's current relationship with siblings: Distant; Patient reports his siblings live back in [place].  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  How long has patient been employed?: "A very long time"  What is the longest time patient has a held a job?: Over 10 years  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 Has patient ever been in the [employer] and Companymilitary?: No Has patient ever served in combat?: No Did You Receive Any Psychiatric Treatment/Services While in Equities traderthe Military?: No  Financial Resources:   Financial resources: Income from employment Does patient have a representative payee or guardian?: No  Alcohol/Substance Abuse:   What has been your use of drugs/alcohol within the last 12 months?: Patient denies, however UDS was positive for meth and cocaine  If attempted suicide, did drugs/alcohol play a role in this?: No Alcohol/Substance Abuse Treatment Hx: Denies past history Has alcohol/substance abuse ever caused legal problems?: No  Social Support System:   Conservation officer, natureatient's Community Support System: Poor Describe  Community Support System: Patient  repots his family is back in [place].  Type of faith/religion: " I believe in God" How does patient's faith help to cope with current illness?: Prayer   Leisure/Recreation:   Leisure and Hobbies: "I like to fix things"   Strengths/Needs:   What things does the patient do well?: Construction and fixing things  In what areas does patient struggle / problems for patient: Patient denies having any struggle areas   Discharge Plan:   Does patient have access to transportation?: No Plan for no access to transportation at discharge: Bus pass Will patient be returning to same living situation after discharge?: Yes Currently receiving community mental health services: No If no, would patient like referral for services when discharged?: Yes (What county?)([place] ) Does patient have financial barriers related to discharge medications?: No  Summary/Recommendations:   Summary and Recommendations (to be completed by the evaluator): [name] is a 42 year old male, who is diagnosed with Major depressive disorder, Single episode, With psychotic features. He presented to the hospital seeking treatment for suicidal ideation and hallucinations of bugs crawling over hime. CSW completed this assessment with the Dr. Jama Flavors due to the patient's primary langauge being Spanish. During the assessment, the patient was tearful, however was able to provide information. [name] states that he came to the hospital so that he can get rid of the bugs that he claims continues to bite him. He reports that his current girlfriend may have had a dog that had fleas. [name] reports he does not use any substances, however his UDS was positive with meth and cocaine. [name] seems to beleive that he was actually seeing bugs, and does not beleive he is hallucinating. [name] does not have any outpatient providers. [name] can benefit from crisis stabilization, medication management, therapeutic milieu and referral services.    Maeola Sarah. 04/04/2017

## 2017-04-04 NOTE — Tx Team (Signed)
Initial Treatment Plan 04/04/2017 1:32 AM Jesus PittsLuis Franco [name] ZOX:[id number]    PATIENT STRESSORS: Educational concerns Financial difficulties Health problems   PATIENT STRENGTHS: Capable of independent living Astronomerinancial means General fund of knowledge   PATIENT IDENTIFIED PROBLEMS: "I have to get rid of the bugs"  "I need the itching to stop"                   DISCHARGE CRITERIA:  Ability to meet basic life and health needs Adequate post-discharge living arrangements Improved stabilization in mood, thinking, and/or behavior  PRELIMINARY DISCHARGE PLAN: Attend aftercare/continuing care group  PATIENT/FAMILY INVOLVEMENT: This treatment plan has been presented to and reviewed with the patient, [name].  The patient and family have been given the opportunity to ask questions and make suggestions.  Jonetta SpeakAshton E Kimm Ungaro, RN 04/04/2017, 1:32 AM
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
## 2017-04-04 NOTE — [facility] Group Notes (Signed)
LCSW Group Therapy Note 04/04/2017 3:36 PM  Type of Therapy and Topic: Group Therapy: Feelings around Relapse and Recovery  Participation Level: Active   Description of Group:  Patients in this group will discuss emotions they experience before and after a relapse. They will process how experiencing these feelings, or avoidance of experiencing them, relates to having a relapse. Facilitator will guide patients to explore emotions they have related to recovery. Patients will be encouraged to process which emotions are more powerful. They will be guided to discuss the emotional reaction significant others in their lives may have to their relapse or recovery. Patients will be assisted in exploring ways to respond to the emotions of others without this contributing to a relapse.  Therapeutic Goals: 1. Patient will identify two or more emotions that lead to a relapse for them 2. Patient will identify two emotions that result when they relapse 3. Patient will identify two emotions related to recovery 4. Patient will demonstrate ability to communicate their needs through discussion and/or role plays  Summary of Patient Progress:   [name] was engaged throughout. He participated and contributed to the group's discussion. [name] stated that he does not know how he will prevent himself from relapsing once he is discharged. He requested for the CSW to ask him next week, when he felt better.    Therapeutic Modalities:  Cognitive Behavioral Therapy Solution-Focused Therapy Assertiveness Training Relapse Prevention Therapy   [name] LCSWA Clinical Social Worker

## 2017-04-04 NOTE — [facility] Suicide Risk Assessment (Signed)
[facility]   Nursing information obtained from:   patient and chart  Demographic factors:   42 year old male, lives alone Current Mental Status:   see below Loss Factors:   financial , relationship issues  Historical Factors:   consider substance abuse based on UDS , history of depression, no prior psychiatric admissions  Risk Reduction Factors:   resilience   Total Time spent with patient: 45 minutes Principal Problem: MDD, with psychotic features  Diagnosis:   Patient Active Problem List   Diagnosis Date Noted  . Severe recurrent major depression with psychotic features (HCC) [F33.3] 04/03/2017   The "Alcohol Use Disorders Identification Test", Guidelines for Use in Primary Care, Second Edition.  World Science writerHealth Organization [facility](WHO). Score between 0-7:  no or low risk or alcohol related problems. Score between 8-15:  moderate risk of alcohol related problems. Score between 16-19:  high risk of alcohol related problems. Score 20 or above:  warrants further diagnostic evaluation for alcohol dependence and treatment.   CLINICAL FACTORS:   42 year old male, who reports somatic delusions of insect infestation , formication, and also worsening depression with recent suicidal ideations. His admission UDS is positive for amphetamines and cocaine, but denies pattern of substance abuse, states used only once .    Psychiatric Specialty Exam: Physical Exam  ROS  Blood pressure 116/89, pulse 85, temperature 98.1 F (36.7 C), temperature source Oral, resp. rate 18, height 5\' 6"  (1.676 m), weight 84.8 kg (187 lb), SpO2 98 %.Body mass index is 30.18 kg/m.   See Admit Note MSE                                                        COGNITIVE FEATURES THAT CONTRIBUTE TO RISK:  Closed-mindedness and Loss of executive function    SUICIDE RISK:   Moderate:  Frequent suicidal ideation with limited intensity, and duration, some specificity in terms  of plans, no associated intent, good self-control, limited dysphoria/symptomatology, some risk factors present, and identifiable protective factors, including available and accessible social support.  PLAN OF CARE: Patient will be admitted to inpatient psychiatric unit for stabilization and safety. Will provide and encourage milieu participation. Provide medication management and maked adjustments as needed.  Will follow daily.    I certify that inpatient services furnished can reasonably be expected to improve the patient's condition.   Craige CottaFernando A Aubree Doody, MD 04/04/2017, 10:34 AM

## 2017-04-05 DIAGNOSIS — F1490 Cocaine use, unspecified, uncomplicated: Secondary | ICD-10-CM

## 2017-04-05 DIAGNOSIS — Z87891 Personal history of nicotine dependence: Secondary | ICD-10-CM

## 2017-04-05 DIAGNOSIS — F1990 Other psychoactive substance use, unspecified, uncomplicated: Secondary | ICD-10-CM

## 2017-04-05 DIAGNOSIS — G4700 Insomnia, unspecified: Secondary | ICD-10-CM

## 2017-04-05 LAB — LIPID PANEL
CHOL/HDL RATIO: 3.7 ratio
CHOLESTEROL: 198 mg/dL (ref 0–200)
HDL: 54 mg/dL (ref >40)
LDL CALC: 122 mg/dL — AB (ref 0–99)
TRIGLYCERIDES: 109 mg/dL (ref <150)
VLDL: 22 mg/dL (ref 0–40)

## 2017-04-05 MED ORDER — OLANZAPINE 5 MG PO TABS
5.0000 mg | ORAL_TABLET | Freq: Every day | ORAL | Status: DC
  Administered 2017-04-06 – 2017-04-07 (×2): 5 mg via ORAL
  Filled 2017-04-05 (×3): qty 1
  Filled 2017-04-05: qty 2

## 2017-04-05 NOTE — [facility] Group Notes (Signed)
[facility] Group Notes:  (Nursing/MHT/Case Management/Adjunct)  Date:  04/05/2017  Time:1:15 PM Type of Therapy:  Nurse Education  Participation Level:  Did Not Attend  Participation Quality:   Did not attend  Affect:  Did not attend  Cognitive:  Did not attend  Insight:  None  Engagement in Group:  Did not attend  Modes of Intervention:  Discussion and Education  Summary of Progress/Problems:  Jesus NevinValerie S Riven [name] 04/05/2017, 3:13 PM

## 2017-04-05 NOTE — Progress Notes (Signed)
[facility] MD Progress Note  04/05/2017 5:50 PM [name]  MRN:  [id number] Subjective: [name] reports " I am okay, a little dizzy after the shot in arm?"  Objective: [name] is awake, alert. Reports dizziness after influenza and phenomena  vaccine.    Denies suicidal or homicidal ideation. Denies auditory or visual hallucinations.  Reports taken medications as prescribed and states he is tolerating medications well.  Patient denies tactical  hallucination during this assessment. States " I am just sleeping to feel better"  patient does not appear to be responding to internal stimuli. Reports good appetite. Support, encouragement and reassurance was provided.   Principal Problem: <principal problem not specified> Diagnosis:   Patient Active Problem List   Diagnosis Date Noted  . Severe recurrent major depression with psychotic features ([place]) [F33.3] 04/03/2017   Total Time spent with patient: 15 minutes  Past Psychiatric History:   Past Medical History:  Past Medical History:  Diagnosis Date  . Asthma     Past Surgical History:  Procedure Laterality Date  . CHOLECYSTECTOMY     Family History: History reviewed. No pertinent family history. Family Psychiatric  History:  Social History:  Social History   Substance and Sexual Activity  Alcohol Use No  . Frequency: Never     Social History   Substance and Sexual Activity  Drug Use Yes  . Types: Amphetamines, Cocaine    Social History   Socioeconomic History  . Marital status: Legally Separated    Spouse name: None  . Number of children: None  . Years of education: None  . Highest education level: None  Social Needs  . Financial resource strain: None  . Food insecurity - worry: None  . Food insecurity - inability: None  . Transportation needs - medical: None  . Transportation needs - non-medical: None  Occupational History  . None  Tobacco Use  . Smoking status: Former Research scientist (life sciences)  . Smokeless tobacco:  Never Used  Substance and Sexual Activity  . Alcohol use: No    Frequency: Never  . Drug use: Yes    Types: Amphetamines, Cocaine  . Sexual activity: None  Other Topics Concern  . None  Social History Narrative  . None   Additional Social History:    Pain Medications: See MAR Prescriptions: See MAR Over the Counter: See MAR History of alcohol / drug use?: Yes Longest period of sobriety (when/how long): Ukn Name of Substance 1: Cocaine 1 - Age of First Use: ukn 1 - Amount (size/oz): ukn 1 - Frequency: ukn 1 - Duration: ukn 1 - Last Use / Amount: tested positive in ER three days ago but denies today at [facility] Name of Substance 2: Amphetimines 2 - Age of First Use: ukn 2 - Amount (size/oz): ukn 2 - Frequency: ukn 2 - Duration: ukn 2 - Last Use / Amount: tested positive in ER three days ago but denies today at [facility]                Sleep: Fair  Appetite:  Fair  Current Medications: Current Facility-Administered Medications  Medication Dose Route Frequency Provider Last Rate Last Dose  . acetaminophen (TYLENOL) tablet 650 mg  650 mg Oral Q6H PRN Lindon Romp A, NP      . albuterol (PROVENTIL HFA;VENTOLIN HFA) 108 (90 Base) MCG/ACT inhaler 1-2 puff  1-2 puff Inhalation Q6H PRN Lindon Romp A, NP      . alum & mag hydroxide-simeth (MAALOX/MYLANTA) 200-200-20 MG/5ML suspension 30  mL  30 mL Oral Q4H PRN Lindon Romp A, NP      . hydrOXYzine (ATARAX/VISTARIL) tablet 25 mg  25 mg Oral Q6H PRN Lindon Romp A, NP      . loratadine (CLARITIN) tablet 10 mg  10 mg Oral Daily Lindon Romp A, NP   10 mg at 04/05/17 0835  . magnesium hydroxide (MILK OF MAGNESIA) suspension 30 mL  30 mL Oral Daily PRN Lindon Romp A, NP      . OLANZapine (ZYPREXA) tablet 2.5 mg  2.5 mg Oral Daily Brando Taves, Myer Peer, MD   2.5 mg at 04/05/17 0835  . OLANZapine (ZYPREXA) tablet 5 mg  5 mg Oral QHS Orrie Schubert, Myer Peer, MD   5 mg at 04/04/17 2158  . permethrin (ELIMITE) 5 % cream   Topical QODAY Lindon Romp A,  NP      . sertraline (ZOLOFT) tablet 50 mg  50 mg Oral Daily Laurena Valko, Myer Peer, MD   50 mg at 04/05/17 0836  . traZODone (DESYREL) tablet 50 mg  50 mg Oral QHS,MR X 1 Lindon Romp A, NP   50 mg at 04/04/17 2159  . triamcinolone cream (KENALOG) 0.1 %   Topical BID Rozetta Nunnery, NP        Lab Results:  Results for orders placed or performed during the hospital encounter of 04/03/17 (from the past 48 hour(s))  CBC     Status: None   Collection Time: 04/04/17  7:38 AM  Result Value Ref Range   WBC 6.2 4.0 - 10.5 K/uL   RBC 4.49 4.22 - 5.81 MIL/uL   Hemoglobin 14.6 13.0 - 17.0 g/dL   HCT 42.1 39.0 - 52.0 %   MCV 93.8 78.0 - 100.0 fL   MCH 32.5 26.0 - 34.0 pg   MCHC 34.7 30.0 - 36.0 g/dL   RDW 12.9 11.5 - 15.5 %   Platelets 329 150 - 400 K/uL    Comment: Performed at [facility], [place] [address], [place], [place] [address]  Comprehensive metabolic panel     Status: Abnormal   Collection Time: 04/04/17  7:38 AM  Result Value Ref Range   Sodium 142 135 - 145 mmol/L   Potassium 3.5 3.5 - 5.1 mmol/L   Chloride 108 101 - 111 mmol/L   CO2 28 22 - 32 mmol/L   Glucose, Bld 147 (H) 65 - 99 mg/dL   BUN 12 6 - 20 mg/dL   Creatinine, Ser 0.86 0.61 - 1.24 mg/dL   Calcium 9.9 8.9 - 10.3 mg/dL   Total Protein 6.6 6.5 - 8.1 g/dL   Albumin 3.9 3.5 - 5.0 g/dL   AST 19 15 - 41 U/L   ALT 17 17 - 63 U/L   Alkaline Phosphatase 88 38 - 126 U/L   Total Bilirubin 0.2 (L) 0.3 - 1.2 mg/dL   GFR calc non Af Amer >60 >60 mL/min   GFR calc Af Amer >60 >60 mL/min    Comment: (NOTE) The eGFR has been calculated using the CKD EPI equation. This calculation has not been validated in all clinical situations. eGFR's persistently <60 mL/min signify possible Chronic Kidney Disease.    Anion gap 6 5 - 15    Comment: Performed at [facility], [place] [address], [place], [place] [address]  Hemoglobin A1c     Status: None   Collection Time: 04/04/17  7:38 AM  Result Value Ref  Range   Hgb A1c MFr Bld 5.4  4.8 - 5.6 %    Comment: (NOTE) Pre diabetes:          5.7%-6.4% Diabetes:              >6.4% Glycemic control for   <7.0% adults with diabetes    Mean Plasma Glucose 108.28 mg/dL    Comment: Performed at [facility], [place] [address] [place], [place] [address]  Lipid panel     Status: Abnormal   Collection Time: 04/04/17  7:38 AM  Result Value Ref Range   Cholesterol 178 0 - 200 mg/dL   Triglycerides 182 (H) <150 mg/dL   HDL 50 >40 mg/dL   Total CHOL/HDL Ratio 3.6 RATIO   VLDL 36 0 - 40 mg/dL   LDL Cholesterol 92 0 - 99 mg/dL    Comment:        Total Cholesterol/HDL:CHD Risk Coronary Heart Disease Risk Table                     Men   Women  1/2 Average Risk   3.4   3.3  Average Risk       5.0   4.4  2 X Average Risk   9.6   7.1  3 X Average Risk  23.4   11.0        Use the calculated Patient Ratio above and the CHD Risk Table to determine the patient's CHD Risk.        ATP III CLASSIFICATION (LDL):  <100     mg/dL   Optimal  100-129  mg/dL   Near or Above                    Optimal  130-159  mg/dL   Borderline  160-189  mg/dL   High  >190     mg/dL   Very High Performed at [facility] [address] [place], [place] [address]   TSH     Status: None   Collection Time: 04/04/17  7:38 AM  Result Value Ref Range   TSH 1.878 0.350 - 4.500 uIU/mL    Comment: Performed by a 3rd Generation assay with a functional sensitivity of <=0.01 uIU/mL. Performed at [facility], [place] [address] [place], [place] [address]   Lipid panel     Status: Abnormal   Collection Time: 04/05/17  6:41 AM  Result Value Ref Range   Cholesterol 198 0 - 200 mg/dL   Triglycerides 109 <150 mg/dL   HDL 54 >40 mg/dL   Total CHOL/HDL Ratio 3.7 RATIO   VLDL 22 0 - 40 mg/dL   LDL Cholesterol 122 (H) 0 - 99 mg/dL    Comment:        Total Cholesterol/HDL:CHD Risk Coronary Heart Disease Risk Table                     Men    Women  1/2 Average Risk   3.4   3.3  Average Risk       5.0   4.4  2 X Average Risk   9.6   7.1  3 X Average Risk  23.4   11.0        Use the calculated Patient Ratio above and the CHD Risk Table to determine the patient's CHD Risk.        ATP III CLASSIFICATION (LDL):  <100     mg/dL   Optimal  100-129  mg/dL   Near or Above                    Optimal  130-159  mg/dL   Borderline  160-189  mg/dL   High  >190     mg/dL   Very High Performed at [facility] [address] [place], [place] [address]     Blood Alcohol level:  No results found for: [facility]  Metabolic Disorder Labs: Lab Results  Component Value Date   HGBA1C 5.4 04/04/2017   MPG 108.28 04/04/2017   No results found for: PROLACTIN Lab Results  Component Value Date   CHOL 198 04/05/2017   TRIG 109 04/05/2017   HDL 54 04/05/2017   CHOLHDL 3.7 04/05/2017   VLDL 22 04/05/2017   LDLCALC 122 (H) 04/05/2017   LDLCALC 92 04/04/2017    Physical Findings: AIMS:  , ,  ,  ,    CIWA:    COWS:     Musculoskeletal: Strength & Muscle Tone: within normal limits Gait & Station: normal Patient leans: N/A  Psychiatric Specialty Exam: Physical Exam  Nursing note reviewed. Constitutional: He is oriented to person, place, and time. He appears well-developed.  HENT:  Head: Normocephalic.  Neurological: He is oriented to person, place, and time.  Psychiatric: He has a normal mood and affect.    Review of Systems  Psychiatric/Behavioral: Positive for depression. Negative for hallucinations.  All other systems reviewed and are negative.   Blood pressure 132/84, pulse 70, temperature (!) 97.5 F (36.4 C), temperature source Oral, resp. rate 20, height 5' 6"  (1.676 m), weight 84.8 kg (187 lb), SpO2 98 %.Body mass index is 30.18 kg/m.  General Appearance: Casual  Eye Contact:  Fair  Speech:  Clear and Coherent  Volume:  Normal  Mood:  Anxious and Depressed  Affect:  Congruent  Thought Process:   Coherent  Orientation:  Full (Time, Place, and Person)  Thought Content:  WDL  Suicidal Thoughts:  No  Homicidal Thoughts:  No  Memory:  Immediate;   Fair Recent;   Fair Remote;   Fair  Judgement:  Fair  Insight:  Fair  Psychomotor Activity:  Normal  Concentration:  Concentration: Fair  Recall:  [employer] of Knowledge:  Fair  Language:  Fair  Akathisia:  No  Handed:  Right  AIMS (if indicated):     Assets:  Agricultural consultant Housing Physical Health Resilience  ADL's:  Intact  Cognition:  WNL  Sleep:  Number of Hours: 6.5   Treatment Plan Summary: Daily contact with patient to assess and evaluate symptoms and progress in treatment and Medication management   Continue with current treatment plan on 04/05/2017 expect where noted.    Increased Zyprexa 2.83m QD to 5 mg continue 5 mg QHS, Continue Zoloft 50 mg mood stabilization. Continue with Trazodone 50 mg for insomnia  Will continue to monitor vitals ,medication compliance and treatment side effects while patient is here.  Reviewed labs: BAL - , UDS - pos for cocaine and amphetamines   CSW will start working on disposition.  Patient to participate in therapeutic milieu    [facility] NP 04/05/2017, 5:50 PM   Agree with NP Progress Note

## 2017-04-05 NOTE — [facility] Group Notes (Signed)
LCSW Group Therapy Note  04/05/2017 9:30-10:30AM - 300 Hall, 10:30-11:30 - 400 Hall, 11:30-12:00 - 500 Hall  Type of Therapy and Topic:  Group Therapy: Anger Cues and Responses  Participation Level:  Minimal   Description of Group:   In this group, patients learned how to recognize the physical, cognitive, emotional, and behavioral responses they have to anger-provoking situations.  They identified a recent time they became angry and how they reacted.  They analyzed how their reaction was possibly beneficial and how it was possibly unhelpful.  The group discussed a variety of healthier coping skills that could help with such a situation in the future.  Deep breathing was practiced briefly.  Therapeutic Goals: 1. Patients will remember their last incident of anger and how they felt emotionally and physically, what their thoughts were at the time, and how they behaved. 2. Patients will identify how their behavior at that time worked for them, as well as how it worked against them. 3. Patients will explore possible new behaviors to use in future anger situations. 4. Patients will learn that anger itself is normal and cannot be eliminated, and that healthier reactions can assist with resolving conflict rather than worsening situations.  Summary of Patient Progress:  The patient shared that their most recent time of anger was a week ago and while he listened attentively, he did not participate in the discussion.  Therapeutic Modalities:   Cognitive Behavioral Therapy  Lynnell ChadMareida J Grossman-Orr  04/05/2017 8:34 AM

## 2017-04-05 NOTE — Progress Notes (Signed)
D. Pt presents with a flat affect and depressed behavior. Pt calm and cooperative, but remains flat during interactions.Pt complaining of dizziness this am. Pt given personal small pitcher of water and encouraged to drink fluids.Pt currently denies SI/HI and AVH  A. Labs and vitals monitored. Pt compliant with medications. Pt supported emotionally and encouraged to express concerns and ask questions.   R. Pt remains safe with 15 minute checks. Will continue POC.

## 2017-04-05 NOTE — Progress Notes (Signed)
D.  Pt presents with flat affect on approach, denies complaints at this time. Pt was positive for evening wrap up group, observed engaged in appropriate interaction with peers on the unit.  Pt denies SI/HI/AVH at this time.  A.  Support and encouragement offered, medication given as ordered  R.  Pt remains safe on the unit, will continue to monitor.

## 2017-04-05 NOTE — Progress Notes (Signed)
Writer spoke with patient 1:1 and he was observed lying in bed resting. Writer informed him that he has medications to take and he got up and came to medication window. He is very soft spoken and flat affect. He has been isolative to his room. He did not attend AA and returned to his room after taking medication and snack. Support given and safety maintained on unit with 15 min checks.

## 2017-04-06 NOTE — Progress Notes (Signed)
[facility] MD Progress Note  04/06/2017 1:57 PM [name]  MRN:  [id number]   Subjective: [name] reports " I am feeling okay, I just have a little headache."  Objective: [name] seen resting in bed. Continues to deny suicidal or homicidal ideation. Denies auditory, visual or tactical hallucinations.  Patient reports he felt like his neighbor had put fleas or bugs in his room, because he started itching after his neighbor left.  Reports taken medications as prescribed and states he is tolerating medications well. Zyprexa was adjusted on 04/05/2017 reports he is tolerating medications well. Patient reports attending group session and states he has been engaged and participating. Patient reports he is not resting well during the night. Sleeping hygiene was discussed and patient was encouraged to stay awake during the day.  Reports good appetite. Support, encouragement and reassurance was provided.   Principal Problem: Severe recurrent major depression with psychotic features [facility]) Diagnosis:   Patient Active Problem List   Diagnosis Date Noted  . Severe recurrent major depression with psychotic features ([facility]) [F33.3] 04/03/2017   Total Time spent with patient: 15 minutes  Past Psychiatric History:   Past Medical History:  Past Medical History:  Diagnosis Date  . Asthma     Past Surgical History:  Procedure Laterality Date  . CHOLECYSTECTOMY     Family History: History reviewed. No pertinent family history. Family Psychiatric  History:  Social History:  Social History   Substance and Sexual Activity  Alcohol Use No  . Frequency: Never     Social History   Substance and Sexual Activity  Drug Use Yes  . Types: Amphetamines, Cocaine    Social History   Socioeconomic History  . Marital status: Legally Separated    Spouse name: None  . Number of children: None  . Years of education: None  . Highest education level: None  Social Needs  . Financial resource strain:  None  . Food insecurity - worry: None  . Food insecurity - inability: None  . Transportation needs - medical: None  . Transportation needs - non-medical: None  Occupational History  . None  Tobacco Use  . Smoking status: Former Games developer  . Smokeless tobacco: Never Used  Substance and Sexual Activity  . Alcohol use: No    Frequency: Never  . Drug use: Yes    Types: Amphetamines, Cocaine  . Sexual activity: None  Other Topics Concern  . None  Social History Narrative  . None   Additional Social History:    Pain Medications: See MAR Prescriptions: See MAR Over the Counter: See MAR History of alcohol / drug use?: Yes Longest period of sobriety (when/how long): Ukn Name of Substance 1: Cocaine 1 - Age of First Use: ukn 1 - Amount (size/oz): ukn 1 - Frequency: ukn 1 - Duration: ukn 1 - Last Use / Amount: tested positive in ER three days ago but denies today at [facility] Name of Substance 2: Amphetimines 2 - Age of First Use: ukn 2 - Amount (size/oz): ukn 2 - Frequency: ukn 2 - Duration: ukn 2 - Last Use / Amount: tested positive in ER three days ago but denies today at [facility]                Sleep: Fair  Appetite:  Fair  Current Medications: Current Facility-Administered Medications  Medication Dose Route Frequency Provider Last Rate Last Dose  . acetaminophen (TYLENOL) tablet 650 mg  650 mg Oral Q6H PRN Jackelyn Poling, NP  650 mg at 04/06/17 1019  . albuterol (PROVENTIL HFA;VENTOLIN HFA) 108 (90 Base) MCG/ACT inhaler 1-2 puff  1-2 puff Inhalation Q6H PRN Nira Conn A, NP   1 puff at 04/06/17 0737  . alum & mag hydroxide-simeth (MAALOX/MYLANTA) 200-200-20 MG/5ML suspension 30 mL  30 mL Oral Q4H PRN Nira Conn A, NP      . hydrOXYzine (ATARAX/VISTARIL) tablet 25 mg  25 mg Oral Q6H PRN Nira Conn A, NP      . loratadine (CLARITIN) tablet 10 mg  10 mg Oral Daily Nira Conn A, NP   10 mg at 04/06/17 0733  . magnesium hydroxide (MILK OF MAGNESIA) suspension 30 mL  30 mL  Oral Daily PRN Nira Conn A, NP      . OLANZapine (ZYPREXA) tablet 5 mg  5 mg Oral QHS Cobos, Rockey Situ, MD   5 mg at 04/05/17 2158  . OLANZapine (ZYPREXA) tablet 5 mg  5 mg Oral Daily Oneta Rack, NP   5 mg at 04/06/17 0733  . permethrin (ELIMITE) 5 % cream   Topical QODAY Nira Conn A, NP      . sertraline (ZOLOFT) tablet 50 mg  50 mg Oral Daily Cobos, Rockey Situ, MD   50 mg at 04/06/17 0733  . traZODone (DESYREL) tablet 50 mg  50 mg Oral QHS,MR X 1 Nira Conn A, NP   50 mg at 04/05/17 2158  . triamcinolone cream (KENALOG) 0.1 %   Topical BID Jackelyn Poling, NP        Lab Results:  Results for orders placed or performed during the hospital encounter of 04/03/17 (from the past 48 hour(s))  Lipid panel     Status: Abnormal   Collection Time: 04/05/17  6:41 AM  Result Value Ref Range   Cholesterol 198 0 - 200 mg/dL   Triglycerides 960 <454 mg/dL   HDL 54 >09 mg/dL   Total CHOL/HDL Ratio 3.7 RATIO   VLDL 22 0 - 40 mg/dL   LDL Cholesterol 811 (H) 0 - 99 mg/dL    Comment:        Total Cholesterol/HDL:CHD Risk Coronary Heart Disease Risk Table                     Men   Women  1/2 Average Risk   3.4   3.3  Average Risk       5.0   4.4  2 X Average Risk   9.6   7.1  3 X Average Risk  23.4   11.0        Use the calculated Patient Ratio above and the CHD Risk Table to determine the patient's CHD Risk.        ATP III CLASSIFICATION (LDL):  <100     mg/dL   Optimal  914-782  mg/dL   Near or Above                    Optimal  130-159  mg/dL   Borderline  956-213  mg/dL   High  >086     mg/dL   Very High Performed at [facility], [address], [place], [place] [address]     Blood Alcohol level:  No results found for: [facility]  Metabolic Disorder Labs: Lab Results  Component Value Date   HGBA1C 5.4 04/04/2017   MPG 108.28 04/04/2017   No results found for: PROLACTIN Lab Results  Component Value Date   CHOL  198 04/05/2017   TRIG 109 04/05/2017    HDL 54 04/05/2017   CHOLHDL 3.7 04/05/2017   VLDL 22 04/05/2017   LDLCALC 122 (H) 04/05/2017   LDLCALC 92 04/04/2017    Physical Findings: AIMS: Facial and Oral Movements Muscles of Facial Expression: None, normal Lips and Perioral Area: None, normal Jaw: None, normal Tongue: None, normal,Extremity Movements Upper (arms, wrists, hands, fingers): None, normal Lower (legs, knees, ankles, toes): None, normal, Trunk Movements Neck, shoulders, hips: None, normal, Overall Severity Severity of abnormal movements (highest score from questions above): None, normal Incapacitation due to abnormal movements: None, normal Patient's awareness of abnormal movements (rate only patient's report): No Awareness, Dental Status Current problems with teeth and/or dentures?: No Does patient usually wear dentures?: No  CIWA:    COWS:     Musculoskeletal: Strength & Muscle Tone: within normal limits Gait & Station: normal Patient leans: N/A  Psychiatric Specialty Exam: Physical Exam  Nursing note reviewed. Constitutional: He is oriented to person, place, and time. He appears well-developed.  HENT:  Head: Normocephalic.  Neurological: He is oriented to person, place, and time.  Psychiatric: He has a normal mood and affect.    Review of Systems  Skin: Positive for itching and rash.  Psychiatric/Behavioral: Positive for depression. Negative for hallucinations.  All other systems reviewed and are negative.   Blood pressure 113/83, pulse 69, temperature (!) 97.5 F (36.4 C), temperature source Oral, resp. rate 20, height 5\' 6"  (1.676 m), weight 84.8 kg (187 lb), SpO2 98 %.Body mass index is 30.18 kg/m.  General Appearance: Casual, paper scrubs    Eye Contact:  Fair  Speech:  Clear and Coherent  Volume:  Normal  Mood:  Anxious and Depressed  Affect:  Congruent  Thought Process:  Coherent  Orientation:  Full (Time, Place, and Person)  Thought Content:  WDL  Suicidal Thoughts:  No  Homicidal  Thoughts:  No  Memory:  Immediate;   Fair Recent;   Fair Remote;   Fair  Judgement:  Fair  Insight:  Fair  Psychomotor Activity:  Normal  Concentration:  Concentration: Fair  Recall:  [employer]  Fund of Knowledge:  Fair  Language:  Fair  Akathisia:  No  Handed:  Right  AIMS (if indicated):     Assets:  ArchitectCommunication Skills Financial Resources/Insurance Housing Physical Health Resilience  ADL's:  Intact  Cognition:  WNL  Sleep:  Number of Hours: 6.75   Treatment Plan Summary: Daily contact with patient to assess and evaluate symptoms and progress in treatment and Medication management   Continue with current treatment plan on 04/06/2017 expect where noted.  Mood stabilization:   Continue Zyprexa 5 mg BID    Continue Zoloft 50 mg   Continue with Trazodone 50 mg for insomnia  Will continue to monitor vitals ,medication compliance and treatment side effects while patient is here.  Reviewed labs: BAL - , UDS - pos for cocaine and amphetamines   CSW will start working on disposition.  Patient to participate in therapeutic milieu    Oneta Rackanika N Lewis, NP 04/06/2017, 1:57 PM  Agree with NP Progress Note

## 2017-04-06 NOTE — [facility] Group Notes (Signed)
[facility] Group Notes:  (Nursing/MHT/Case Management/Adjunct)  Date:  04/06/2017  Time:  11:51 AM  Type of Therapy:  Orientation/Goals group  Participation Level:  Did Not Attend  Participation Quality:  Did Not Attend  Affect:  Did Not Attend  Cognitive:  Did Not Attend  Insight:  None  Engagement in Group:  Did Not Attend  Modes of Intervention:  Did Not Attend  Summary of Progress/Problems: Pt did not attend patient self inventory group/orientation group.   Jacquelyne BalintForrest, Mihira Tozzi Shanta 04/06/2017, 11:51 AM

## 2017-04-06 NOTE — Progress Notes (Signed)
D.  Pt presents with flat affect, pleasant with conversation.  Pt denies complaints at this time.  Pt was observed engaged in appropriate interaction with peers on the unit.  Pt was positive for evening AA group.  Pt denies SI/HI/AVH at this time.  A.  Support and encouragement offered, medication given as ordered  R.  Pt remains safe on the unit, will continue to monitor.

## 2017-04-06 NOTE — Progress Notes (Signed)
D. Pt presents with a flat affect and depressed behavior, brightens somewhat during interactions. Pt reports having slept poorly last night and endorses low energy and poor concentration.  Pt somewhat isolative to room this morning. Per pt's self inventory, pt rate his depression, hopelessness and anxiety an 8/8/8, respectively. Pt endorses passive SI- verbally contracting for safety. Pt denies AVhallucinations  A. Labs and vitals monitored. Pt compliant with medications. Pt supported emotionally and encouraged to express concerns and ask questions.   R. Pt remains safe with 15 minute checks. Will continue POC.

## 2017-04-06 NOTE — [facility] Group Notes (Signed)
[facility]  Date/Time:  04/06/2017 10:00-11:00AM  Type of Therapy and Topic:  Group Therapy:  Healthy and Unhealthy Supports  Participation Level:  Active   Description of Group:  Patients in this group were introduced to the idea of adding a variety of healthy supports to address the various needs in their lives.Patients discussed what additional healthy supports could be helpful in their recovery and wellness after discharge in order to prevent future hospitalizations.   An emphasis was placed on using counselor, doctor, therapy groups, 12-step groups, and problem-specific support groups to expand supports.  They also worked as a group on developing a specific plan for several patients to deal with unhealthy supports through boundary-setting, psychoeducation with loved ones, and even termination of relationships.   Therapeutic Goals:   1)  discuss importance of adding supports to stay well once out of the hospital  2)  compare healthy versus unhealthy supports and identify some examples of each  3)  generate ideas and descriptions of healthy supports that can be added  4)  offer mutual support about how to address unhealthy supports  5)  encourage active participation in and adherence to discharge plan    Summary of Patient Progress:  The patient expressed a willingness to add a doctor and/or therapist to help him figure out what his problems actually is in order to help in his recovery journey.  He was somewhat distressed, stating he was not sure how he will be able to stay away from his friends.   Therapeutic Modalities:   Motivational Interviewing Brief Solution-Focused Therapy  Ambrose MantleMareida Grossman-Orr, LCSW

## 2017-04-06 NOTE — [facility] Group Notes (Signed)
[facility] Group Notes:  (Nursing/MHT/Case Management/Adjunct)  Date:  04/06/2017  Time:  1:56 PM  Type of Therapy:  Psychoeducational Skills  Participation Level:  Did Not Attend  Participation Quality:  Did not attend  Affect:  Did not attend  Cognitive:  Did not attend  Insight:  None  Engagement in Group:  Did not attend  Modes of Intervention:  Did not attend  Summary of Progress/Problems: Pt did not attend Psychoeducational group with topic healthy support systems.    Jacquelyne BalintForrest, Carmita Boom Shanta 04/06/2017, 1:56 PM

## 2017-04-06 NOTE — [facility] Group Notes (Signed)
[facility] Group Notes:  (Nursing/MHT/Case Management/Adjunct)  Date:  04/06/2017  Time:  1:30 PM Type of Therapy:  Nurse Education  Participation Level:Did not attend  Participation Quality:  Did not attend  Affect:  Did not attend  Cognitive:  Did not attend  Insight:  None  Engagement in Group:  None  Modes of Intervention:  Discussion and Education  Summary of Progress/Problems: Did not attend   [name] 04/06/2017, 3:30 PM

## 2017-04-07 DIAGNOSIS — Z79899 Other long term (current) drug therapy: Secondary | ICD-10-CM

## 2017-04-07 MED ORDER — OLANZAPINE 10 MG PO TABS
10.0000 mg | ORAL_TABLET | Freq: Every day | ORAL | Status: DC
  Administered 2017-04-07 – 2017-04-08 (×2): 10 mg via ORAL
  Filled 2017-04-07 (×4): qty 1

## 2017-04-07 NOTE — Progress Notes (Signed)
D:  Patient denied SI and HI, contracts for safety.  Denied A/V hallucinations.  Denied pain. A:  Medications administered per MD orders.  Emotional support and encouragement given patient. R:  Safety maintained with 15 minute checks. Patient has stayed in bed most of the day, not attending groups.

## 2017-04-07 NOTE — Progress Notes (Signed)
[facility] MD Progress Note ( interviewed in [place])  04/07/2017 10:52 AM [name]  MRN:  [id number] Subjective: reports he is feeling better. States he is pleased that " my skin is getting better". States he sill feels some crawling sensations, mainly in his scalp, but states not as significant or severe as before, and seems less concerned as before. Prior to admission he reports he had actually been seeing tiny insects fall off his skin at times, states he is no longer experiencing this .    Objective: I have discussed case with treatment team and have met with patient. Patient reports he is feeling better, less depressed, less anxious. As above, he reports improvement in formication and a decrease in intensity of somatic, infestation delusional ideations. Skin lesions, which are present on face, arms , are improving . Currently remains vaguely anxious, but cooperative, no psychomotor agitation, and endorsing improvement. Denies suicidal ideations. Denies medication side effects. No disruptive or agitated behaviors on unit, visible on unit, improving group participation. We reviewed issues regarding stimulant abuse and the likelihood that substance abuse has been at least a contributor to his symptoms. Have stressed the importance of abstinence from illicit drugs as an integral part of treatment goals.   Principal Problem: Severe recurrent major depression with psychotic features [facility]) Diagnosis:   Patient Active Problem List   Diagnosis Date Noted  . Severe recurrent major depression with psychotic features ([place]) [F33.3] 04/03/2017   Total Time spent with patient: 20 minutes  Past Psychiatric History:   Past Medical History:  Past Medical History:  Diagnosis Date  . Asthma     Past Surgical History:  Procedure Laterality Date  . CHOLECYSTECTOMY     Family History: History reviewed. No pertinent family history. Family Psychiatric  History:  Social History:  Social History    Substance and Sexual Activity  Alcohol Use No  . Frequency: Never     Social History   Substance and Sexual Activity  Drug Use Yes  . Types: Amphetamines, Cocaine    Social History   Socioeconomic History  . Marital status: Legally Separated    Spouse name: None  . Number of children: None  . Years of education: None  . Highest education level: None  Social Needs  . Financial resource strain: None  . Food insecurity - worry: None  . Food insecurity - inability: None  . Transportation needs - medical: None  . Transportation needs - non-medical: None  Occupational History  . None  Tobacco Use  . Smoking status: Former Research scientist (life sciences)  . Smokeless tobacco: Never Used  Substance and Sexual Activity  . Alcohol use: No    Frequency: Never  . Drug use: Yes    Types: Amphetamines, Cocaine  . Sexual activity: None  Other Topics Concern  . None  Social History Narrative  . None   Additional Social History:    Pain Medications: See MAR Prescriptions: See MAR Over the Counter: See MAR History of alcohol / drug use?: Yes Longest period of sobriety (when/how long): Ukn Name of Substance 1: Cocaine 1 - Age of First Use: ukn 1 - Amount (size/oz): ukn 1 - Frequency: ukn 1 - Duration: ukn 1 - Last Use / Amount: tested positive in ER three days ago but denies today at [facility] Name of Substance 2: Amphetimines 2 - Age of First Use: ukn 2 - Amount (size/oz): ukn 2 - Frequency: ukn 2 - Duration: ukn 2 - Last Use / Amount: tested positive  in ER three days ago but denies today at [facility]  Sleep: reports improved sleep  Appetite:  improved   Current Medications: Current Facility-Administered Medications  Medication Dose Route Frequency Provider Last Rate Last Dose  . acetaminophen (TYLENOL) tablet 650 mg  650 mg Oral Q6H PRN Lindon Romp A, NP   650 mg at 04/06/17 1019  . albuterol (PROVENTIL HFA;VENTOLIN HFA) 108 (90 Base) MCG/ACT inhaler 1-2 puff  1-2 puff Inhalation Q6H PRN Lindon Romp A, NP   2 puff at 04/06/17 1927  . alum & mag hydroxide-simeth (MAALOX/MYLANTA) 200-200-20 MG/5ML suspension 30 mL  30 mL Oral Q4H PRN Lindon Romp A, NP      . hydrOXYzine (ATARAX/VISTARIL) tablet 25 mg  25 mg Oral Q6H PRN Lindon Romp A, NP      . loratadine (CLARITIN) tablet 10 mg  10 mg Oral Daily Lindon Romp A, NP   10 mg at 04/07/17 0845  . magnesium hydroxide (MILK OF MAGNESIA) suspension 30 mL  30 mL Oral Daily PRN Lindon Romp A, NP      . OLANZapine (ZYPREXA) tablet 5 mg  5 mg Oral QHS Cobos, Myer Peer, MD   5 mg at 04/06/17 2108  . OLANZapine (ZYPREXA) tablet 5 mg  5 mg Oral Daily Derrill Center, NP   5 mg at 04/07/17 0845  . sertraline (ZOLOFT) tablet 50 mg  50 mg Oral Daily Cobos, Myer Peer, MD   50 mg at 04/07/17 0845  . traZODone (DESYREL) tablet 50 mg  50 mg Oral QHS,MR X 1 Lindon Romp A, NP   50 mg at 04/06/17 2108  . triamcinolone cream (KENALOG) 0.1 %   Topical BID Lindon Romp A, NP   1 application at 59/56/38 1025    Lab Results:  No results found for this or any previous visit (from the past 48 hour(s)).  Blood Alcohol level:  No results found for: [facility]  Metabolic Disorder Labs: Lab Results  Component Value Date   HGBA1C 5.4 04/04/2017   MPG 108.28 04/04/2017   No results found for: PROLACTIN Lab Results  Component Value Date   CHOL 198 04/05/2017   TRIG 109 04/05/2017   HDL 54 04/05/2017   CHOLHDL 3.7 04/05/2017   VLDL 22 04/05/2017   LDLCALC 122 (H) 04/05/2017   LDLCALC 92 04/04/2017    Physical Findings: AIMS: Facial and Oral Movements Muscles of Facial Expression: None, normal Lips and Perioral Area: None, normal Jaw: None, normal Tongue: None, normal,Extremity Movements Upper (arms, wrists, hands, fingers): None, normal Lower (legs, knees, ankles, toes): None, normal, Trunk Movements Neck, shoulders, hips: None, normal, Overall Severity Severity of abnormal movements (highest score from questions above): None, normal Incapacitation  due to abnormal movements: None, normal Patient's awareness of abnormal movements (rate only patient's report): No Awareness, Dental Status Current problems with teeth and/or dentures?: No Does patient usually wear dentures?: No  CIWA:    COWS:     Musculoskeletal: Strength & Muscle Tone: within normal limits Gait & Station: normal Patient leans: N/A  Psychiatric Specialty Exam: Physical Exam  Nursing note reviewed. Constitutional: He is oriented to person, place, and time. He appears well-developed.  HENT:  Head: Normocephalic.  Neurological: He is oriented to person, place, and time.  Psychiatric: He has a normal mood and affect.    Review of Systems  Psychiatric/Behavioral: Positive for depression. Negative for hallucinations.  All other systems reviewed and are negative. reports frequent headaches, no chest pain, no shortness of breath,  no vomiting , no fever, no chills   Blood pressure 114/81, pulse 75, temperature 97.9 F (36.6 C), temperature source Oral, resp. rate 18, height _0  (1.676 m), weight 84.8 kg (187 lb), SpO2 98 %.Body mass index is 30.18 kg/m.  General Appearance: improving grooming   Eye Contact:  fair- improving   Speech:  improving   Volume:  Decreased  Mood:  reports he is feeling better, but presents vaguely sad, blunted in affect   Affect:  partially improved, still constricted and vaguely anxious  Thought Process:  Linear and Descriptions of Associations: Intact  Orientation:  Other:  fully alert and attentive   Thought Content:  denies hallucinations, does not appear internally preoccupied, formication and somatic/parasitic/infestation preoccupations improved but not fully resolved   Suicidal Thoughts:  No denies any suicidal or self injurious ideations, denies any homicidal or violent ideations, contracts for safety on unit   Homicidal Thoughts:  No  Memory: recent and remote fair   Judgement:  Fair- improving   Insight:  Fair  Psychomotor  Activity:  Normal- no current agitation or restlessness   Concentration:  Concentration: improving  and Attention Span: improving   Recall:  [employer] of Knowledge:  Fair  Language:  Good  Akathisia:  No  Handed:  Right  AIMS (if indicated):     Assets:  Desire for Improvement Resilience  ADL's:  Improving   Cognition:  WNL  Sleep:  Number of Hours: 6    Assessment - patient presents with partially improved mood and range of affect, although still vaguely blunted and anxious. Significant decrease in psychotic ( formication, infestation preoccupations) symptoms, but he does continue to report feeling " like there is something crawling in my scalp". He denies any further skin picking and lesions are healing . Denies suicidal ideations. We reviewed the negative impact that illicit drugs/stimulant abuse can have on level of functioning and the likelihood they have contributed to his symptoms. He presented receptive , expressed understanding and stated he planned to remain sober.  Treatment Plan Summary:  Daily contact with patient to assess and evaluate symptoms and progress in treatment and Medication management  Treatment Plan reviewed as below today 2/11. Encourage group and milieu participation to work on Radiographer, therapeutic and symptom reduction Encourage efforts ( insight , motivation ) to work on sobriety and relapse prevention Continue Zoloft 50 mgrs QDAY for depression and anxiety Increase Zyprexa to 10 mgrs QHS for psychosis, mood disorder  Continue Trazodone 50 mgrs QHS PRN for insomnia  Treatment Team working on disposition [place], MD 04/07/2017, 10:52 AM   Patient ID: Finn Amos, male   DOB: 09-18-1975, 42 y.o.   MRN: [id number]

## 2017-04-07 NOTE — Progress Notes (Signed)
Pt did not attend evening wrap up group, remained in bed. 

## 2017-04-07 NOTE — Progress Notes (Signed)
Patient ID: [name], male   DOB: 1975/10/22, 42 y.o.   MRN: [id number]  Pt currently presents with a flat affect and depressed, anxious behavior. Pt reports to writer that their goal is to "go home soon." Pt complains of ongoing generalized itching and an acute headache. Pt reports poor sleep with current medication regimen.   Pt provided with medications per providers orders. Pt's labs and vitals were monitored throughout the night. Pt given a 1:1 about emotional and mental status. Pt supported and encouraged to express concerns and questions. Pt educated on medications.   Pt's safety ensured with 15 minute and environmental checks. Pt currently denies SI/HI and A/V hallucinations. Pt verbally agrees to seek staff if SI/HI or A/VH occurs and to consult with staff before acting on any harmful thoughts. Will continue POC.

## 2017-04-07 NOTE — Progress Notes (Signed)
Recreation Therapy Notes  Date: 04/07/17 Time: 0930 Location: [address] Dayroom  Group Topic: Stress Management  Goal Area(s) Addresses:  Patient will verbalize importance of using healthy stress management.  Patient will identify positive emotions associated with healthy stress management.   Intervention: Stress Management  Activity :  Meditation.  LRT introduced the stress management technique of meditation.  LRT played a meditation that allowed patients to focus on finding forgiveness for people who may have wronged them.  Education:  Stress Management, Discharge Planning.   Education Outcome: Acknowledges edcuation/In group clarification offered/Needs additional education  Clinical Observations/Feedback: Pt did not attend group.    Caroll RancherMarjette Maiyah Goyne, LRT/CTRS         Lillia AbedLindsay, Atticus Lemberger A 04/07/2017 10:45 AM

## 2017-04-07 NOTE — Plan of Care (Signed)
Nurse discussed anxiety, depression and coping skills with patient.  

## 2017-04-07 NOTE — [facility] Group Notes (Deleted)
Pt attended spiritual care group on grief and loss facilitated by chaplain Caitlinn Klinker   Group opened with brief discussion and psycho-social ed around grief and loss in relationships and in relation to self - identifying life patterns, circumstances, changes that cause losses. Established group norm of speaking from own life experience. Group goal of establishing open and affirming space for members to share loss and experience with grief, normalize grief experience and provide psycho social education and grief support.     

## 2017-04-07 NOTE — [facility] Group Notes (Signed)
[facility] Group Notes:  (Nursing/MHT/Case Management/Adjunct)  Date:  04/07/2017  Time:  4:00 p.m.  Type of Therapy:  Psychoeducational Skills  Participation Level:  Did Not Attend  Participation Quality:    Affect:    Cognitive:    Insight:    Engagement in Group:    Modes of Intervention:    Summary of Progress/Problems:  Patient did not attend.   Earline MayotteKnight, [name] 04/07/2017, 6:28 PM

## 2017-04-08 MED ORDER — PERMETHRIN 5 % EX CREA
TOPICAL_CREAM | Freq: Once | CUTANEOUS | Status: AC
  Administered 2017-04-08: 23:00:00 via TOPICAL
  Filled 2017-04-08: qty 60

## 2017-04-08 NOTE — Progress Notes (Signed)
[facility] MD Progress Note  04/08/2017 2:06 PM [name]  MRN:  [id number]   Subjective:  Patient reports that he is feeling much better today. He denies any SI/HI/AVH and contracts for safety. He misses his girlfriend and his dog, but there was too much drama at that house so he left on his own.He feels he is ready for discharge soon.   Objective: Patient's chart and findings reviewed and discussed with treatment team. Patient presents in the milieu interacting appropriately. He has been attending groups. He is pleasant and cooperative. Patient will continue current medication regimen. He denies any medication side effects. He plans to go back to a room he rents and go back to working in [employer] after discharge.   Principal Problem: Severe recurrent major depression with psychotic features [facility]) Diagnosis:   Patient Active Problem List   Diagnosis Date Noted  . Severe recurrent major depression with psychotic features (HCC) [F33.3] 04/03/2017   Total Time spent with patient: 15 minutes  Past Psychiatric History: See H&P  Past Medical History:  Past Medical History:  Diagnosis Date  . Asthma     Past Surgical History:  Procedure Laterality Date  . CHOLECYSTECTOMY     Family History: History reviewed. No pertinent family history. Family Psychiatric  History: See H&P Social History:  Social History   Substance and Sexual Activity  Alcohol Use No  . Frequency: Never     Social History   Substance and Sexual Activity  Drug Use Yes  . Types: Amphetamines, Cocaine    Social History   Socioeconomic History  . Marital status: Legally Separated    Spouse name: None  . Number of children: None  . Years of education: None  . Highest education level: None  Social Needs  . Financial resource strain: None  . Food insecurity - worry: None  . Food insecurity - inability: None  . Transportation needs - medical: None  . Transportation needs - non-medical: None   Occupational History  . None  Tobacco Use  . Smoking status: Former Games developer  . Smokeless tobacco: Never Used  Substance and Sexual Activity  . Alcohol use: No    Frequency: Never  . Drug use: Yes    Types: Amphetamines, Cocaine  . Sexual activity: None  Other Topics Concern  . None  Social History Narrative  . None   Additional Social History:    Pain Medications: See MAR Prescriptions: See MAR Over the Counter: See MAR History of alcohol / drug use?: Yes Longest period of sobriety (when/how long): Ukn Name of Substance 1: Cocaine 1 - Age of First Use: ukn 1 - Amount (size/oz): ukn 1 - Frequency: ukn 1 - Duration: ukn 1 - Last Use / Amount: tested positive in ER three days ago but denies today at [facility] Name of Substance 2: Amphetimines 2 - Age of First Use: ukn 2 - Amount (size/oz): ukn 2 - Frequency: ukn 2 - Duration: ukn 2 - Last Use / Amount: tested positive in ER three days ago but denies today at [facility]                Sleep: Good  Appetite:  Good  Current Medications: Current Facility-Administered Medications  Medication Dose Route Frequency Provider Last Rate Last Dose  . acetaminophen (TYLENOL) tablet 650 mg  650 mg Oral Q6H PRN Nira Conn A, NP   650 mg at 04/08/17 0810  . albuterol (PROVENTIL HFA;VENTOLIN HFA) 108 (90 Base) MCG/ACT inhaler 1-2 puff  1-2 puff Inhalation Q6H PRN Nira ConnBerry, Jason A, NP   2 puff at 04/08/17 1258  . alum & mag hydroxide-simeth (MAALOX/MYLANTA) 200-200-20 MG/5ML suspension 30 mL  30 mL Oral Q4H PRN Nira ConnBerry, Jason A, NP      . hydrOXYzine (ATARAX/VISTARIL) tablet 25 mg  25 mg Oral Q6H PRN Nira ConnBerry, Jason A, NP   25 mg at 04/08/17 0810  . loratadine (CLARITIN) tablet 10 mg  10 mg Oral Daily Nira ConnBerry, Jason A, NP   10 mg at 04/08/17 0807  . magnesium hydroxide (MILK OF MAGNESIA) suspension 30 mL  30 mL Oral Daily PRN Nira ConnBerry, Jason A, NP      . OLANZapine (ZYPREXA) tablet 10 mg  10 mg Oral QHS Cobos, Rockey SituFernando A, MD   10 mg at 04/07/17 2111  .  sertraline (ZOLOFT) tablet 50 mg  50 mg Oral Daily Cobos, Rockey SituFernando A, MD   50 mg at 04/08/17 0807  . traZODone (DESYREL) tablet 50 mg  50 mg Oral QHS,MR X 1 Nira ConnBerry, Jason A, NP   50 mg at 04/07/17 2111  . triamcinolone cream (KENALOG) 0.1 %   Topical BID Nira ConnBerry, Jason A, NP        Lab Results: No results found for this or any previous visit (from the past 48 hour(s)).  Blood Alcohol level:  No results found for: [facility]  Metabolic Disorder Labs: Lab Results  Component Value Date   HGBA1C 5.4 04/04/2017   MPG 108.28 04/04/2017   No results found for: PROLACTIN Lab Results  Component Value Date   CHOL 198 04/05/2017   TRIG 109 04/05/2017   HDL 54 04/05/2017   CHOLHDL 3.7 04/05/2017   VLDL 22 04/05/2017   LDLCALC 122 (H) 04/05/2017   LDLCALC 92 04/04/2017    Physical Findings: AIMS: Facial and Oral Movements Muscles of Facial Expression: None, normal Lips and Perioral Area: None, normal Jaw: None, normal Tongue: None, normal,Extremity Movements Upper (arms, wrists, hands, fingers): None, normal Lower (legs, knees, ankles, toes): None, normal, Trunk Movements Neck, shoulders, hips: None, normal, Overall Severity Severity of abnormal movements (highest score from questions above): None, normal Incapacitation due to abnormal movements: None, normal Patient's awareness of abnormal movements (rate only patient's report): No Awareness, Dental Status Current problems with teeth and/or dentures?: No Does patient usually wear dentures?: No  CIWA:  CIWA-Ar Total: 1 COWS:  COWS Total Score: 1  Musculoskeletal: Strength & Muscle Tone: within normal limits Gait & Station: normal Patient leans: N/A  Psychiatric Specialty Exam: Physical Exam  Nursing note and vitals reviewed. Constitutional: He is oriented to person, place, and time. He appears well-developed and well-nourished.  Cardiovascular: Normal rate.  Respiratory: Effort normal.  Musculoskeletal: Normal range of motion.   Neurological: He is alert and oriented to person, place, and time.  Skin: Skin is warm.    Review of Systems  Constitutional: Negative.   HENT: Negative.   Eyes: Negative.   Respiratory: Negative.   Cardiovascular: Negative.   Gastrointestinal: Negative.   Genitourinary: Negative.   Musculoskeletal: Negative.   Skin: Negative.   Neurological: Negative.   Endo/Heme/Allergies: Negative.   Psychiatric/Behavioral: Negative.     Blood pressure 131/86, pulse 61, temperature 98.1 F (36.7 C), resp. rate 16, height 5\' 6"  (1.676 m), weight 84.8 kg (187 lb), SpO2 98 %.Body mass index is 30.18 kg/m.  General Appearance: Casual  Eye Contact:  Good  Speech:  Clear and Coherent and Normal Rate  Volume:  Normal  Mood:  Depressed  Affect:  Flat  Thought Process:  Goal Directed and Descriptions of Associations: Intact  Orientation:  Full (Time, Place, and Person)  Thought Content:  WDL  Suicidal Thoughts:  No  Homicidal Thoughts:  No  Memory:  Immediate;   Good Recent;   Good Remote;   Good  Judgement:  Good  Insight:  Good  Psychomotor Activity:  Normal  Concentration:  Concentration: Good and Attention Span: Good  Recall:  Good  Fund of Knowledge:  Good  Language:  Good  Akathisia:  No  Handed:  Right  AIMS (if indicated):     Assets:  Communication Skills Desire for Improvement Financial Resources/Insurance Housing Physical Health Social Support Transportation  ADL's:  Intact  Cognition:  WNL  Sleep:  Number of Hours: 6.75   Problems Addressed: MDD severe  Treatment Plan Summary: Daily contact with patient to assess and evaluate symptoms and progress in treatment, Medication management and Plan is to:  -Continue Zoloft 50 mg PO for mood stability -Continue Zyprexa 10 mg PO QHS for mood stability -Continue Trazodone 50 mg PO QHS PRN for insomnia -Continue Vistaril 25 mg PO Q6H PRN for anxiety -Encourage group therapy participation  Maryfrances Bunnell, FNP 04/08/2017,  2:06 PM

## 2017-04-08 NOTE — [facility] Group Notes (Signed)
Adult Psychoeducational Group Note  Date:  04/08/2017 Time:  7:02 PM  Group Topic/Focus:  Activity  Participation Level:  Active   Additional Comments:  [facility]  Philip AspenLatoya O Maloree Uplinger 04/08/2017, 7:02 PM

## 2017-04-08 NOTE — Plan of Care (Signed)
Nurse discussed depression, anxiety, coping skills with patient.  

## 2017-04-08 NOTE — Progress Notes (Signed)
Recreation Therapy Notes  Date: 2.12.19 Time: 2:45 pm  [address]   AAA/T Program Assumption of Risk Form signed by Patient/ or Parent Legal Guardian Yes  Patient is free of allergies or sever asthma Yes  Patient reports no fear of animals Yes  Patient reports no history of cruelty to animals Yes  Patient understands his/her participation is voluntary Yes  Patient washes hands before animal contact Yes  Patient washes hands after animal contact Yes  Behavioral Response: Engaged   Education:Hand Washing, Appropriate Animal Interaction   Education Outcome: Acknowledges education.   Clinical Observations/Feedback: Patient attended session and interacted appropriately with therapy dog and peers. Patient asked appropriate questions about therapy dog and his training. Patient shared stories about their pets at home with group.   Sheryle Hailarian Kaeden Depaz, Recreation Therapy Intern   Sheryle HailDarian Jenell Dobransky 04/08/2017 8:41 AM

## 2017-04-08 NOTE — [facility] Group Notes (Signed)
[facility] [employer] Group Therapy 04/08/2017 1:15pm  Type of Therapy: Mental Health Association Presentation  Participation Level: Active  Participation Quality: Attentive  Affect: Appropriate  Cognitive: Oriented  Insight: Developing/Improving  Engagement in Therapy: Engaged  Modes of Intervention: Discussion, Education and Socialization  Summary of Progress/Problems: Mental Health Association (MHA) Speaker came to talk about his personal journey with mental health. The pt processed ways by which to relate to the speaker. MHA speaker provided handouts and educational information pertaining to groups and services offered by the MHA. Pt was engaged in speaker's presentation and was receptive to resources provided.    Encarnacion Scioneaux N Smart, LCSW 04/08/2017 3:55 PM  

## 2017-04-08 NOTE — [facility] Group Notes (Signed)
[facility] Group Notes:  (Nursing/MHT/Case Management/Adjunct)  Date:  04/08/2017  Time:  3:30 p.m.  Type of Therapy:  Psychoeducational Skills  Participation Level:  Active  Participation Quality:  Appropriate  Affect:  Appropriate  Cognitive:  Appropriate  Insight:  Appropriate  Engagement in Group:  Engaged and Supportive  Modes of Intervention:  Activity and Education  Summary of Progress/Problems:  This is a psychoeducational group especially related to the therapy ball.   Earline MayotteKnight, Amahri Dengel Shephard 04/08/2017, 4:57 PM

## 2017-04-09 DIAGNOSIS — F1990 Other psychoactive substance use, unspecified, uncomplicated: Secondary | ICD-10-CM | POA: Diagnosis present

## 2017-04-09 MED ORDER — HALOPERIDOL 5 MG PO TABS
5.0000 mg | ORAL_TABLET | Freq: Two times a day (BID) | ORAL | Status: DC
  Administered 2017-04-09 – 2017-04-12 (×6): 5 mg via ORAL
  Filled 2017-04-09 (×11): qty 1

## 2017-04-09 NOTE — Progress Notes (Signed)
[facility] MD Progress Note  04/09/2017 4:03 PM [name]  MRN:  [id number] Subjective:   42 y.o Caucasian male, separated, lives with a room mate. Background history of SUD and mood disorder. Presented to the ER via EMS. Reported feeling of bug infestation. Had seen a dermatologist who referred him back to psychiatry. History of stimulant use. Positive for cocaine and amphetamines.   Chart reviewed today. Patient discussed at team today.  Staff reports that he has been preoccupied with his skin. Not reporting any auditory or visual hallucinations.  He has been engaging with unit activities. He has not voiced any futility thoughts.   Seen today. Very preoccupied with his rashes. Has had it since May last year. Has some delusional preoccupation about how it started. Says he was recording it on his phone while alone and heard a male voice. Suspects one of his friend might be doing this to him. Patient has used various topical agents over the months. He has been researching his lesion over the Internet. No suicidal thoughts. No thoughts of harming anyone else. No thoughts of violence.   Principal Problem:  Delusional disorder                                   Substance Induced Mood Disorder Diagnosis:   Patient Active Problem List   Diagnosis Date Noted  . Severe recurrent major depression with psychotic features (HCC) [F33.3] 04/03/2017   Total Time spent with patient: 20 minutes  Past Psychiatric History: As in H&P  Past Medical History:  Past Medical History:  Diagnosis Date  . Asthma     Past Surgical History:  Procedure Laterality Date  . CHOLECYSTECTOMY     Family History: History reviewed. No pertinent family history. Family Psychiatric  History: As in H&P Social History:  Social History   Substance and Sexual Activity  Alcohol Use No  . Frequency: Never     Social History   Substance and Sexual Activity  Drug Use Yes  . Types: Amphetamines, Cocaine    Social  History   Socioeconomic History  . Marital status: Legally Separated    Spouse name: None  . Number of children: None  . Years of education: None  . Highest education level: None  Social Needs  . Financial resource strain: None  . Food insecurity - worry: None  . Food insecurity - inability: None  . Transportation needs - medical: None  . Transportation needs - non-medical: None  Occupational History  . None  Tobacco Use  . Smoking status: Former Games developer  . Smokeless tobacco: Never Used  Substance and Sexual Activity  . Alcohol use: No    Frequency: Never  . Drug use: Yes    Types: Amphetamines, Cocaine  . Sexual activity: None  Other Topics Concern  . None  Social History Narrative  . None   Additional Social History:    Pain Medications: See MAR Prescriptions: See MAR Over the Counter: See MAR History of alcohol / drug use?: Yes Longest period of sobriety (when/how long): Ukn Name of Substance 1: Cocaine 1 - Age of First Use: ukn 1 - Amount (size/oz): ukn 1 - Frequency: ukn 1 - Duration: ukn 1 - Last Use / Amount: tested positive in ER three days ago but denies today at [facility] Name of Substance 2: Amphetimines 2 - Age of First Use: ukn 2 - Amount (size/oz): ukn 2 -  Frequency: ukn 2 - Duration: ukn 2 - Last Use / Amount: [facility]                Sleep: Good  Appetite:  Good  Current Medications: Current Facility-Administered Medications  Medication Dose Route Frequency Provider Last Rate Last Dose  . acetaminophen (TYLENOL) tablet 650 mg  650 mg Oral Q6H PRN Nira ConnBerry, Jason A, NP   650 mg at 04/09/17 1503  . albuterol (PROVENTIL HFA;VENTOLIN HFA) 108 (90 Base) MCG/ACT inhaler 1-2 puff  1-2 puff Inhalation Q6H PRN Nira ConnBerry, Jason A, NP   2 puff at 04/09/17 1211  . alum & mag hydroxide-simeth (MAALOX/MYLANTA) 200-200-20 MG/5ML suspension 30 mL  30 mL Oral Q4H PRN Nira ConnBerry, Jason A, NP      . hydrOXYzine  (ATARAX/VISTARIL) tablet 25 mg  25 mg Oral Q6H PRN Nira ConnBerry, Jason A, NP   25 mg at 04/09/17 1504  . loratadine (CLARITIN) tablet 10 mg  10 mg Oral Daily Nira ConnBerry, Jason A, NP   10 mg at 04/09/17 0835  . magnesium hydroxide (MILK OF MAGNESIA) suspension 30 mL  30 mL Oral Daily PRN Nira ConnBerry, Jason A, NP      . OLANZapine (ZYPREXA) tablet 10 mg  10 mg Oral QHS Cobos, Rockey SituFernando A, MD   10 mg at 04/08/17 2127  . sertraline (ZOLOFT) tablet 50 mg  50 mg Oral Daily Cobos, Rockey SituFernando A, MD   50 mg at 04/09/17 0835  . traZODone (DESYREL) tablet 50 mg  50 mg Oral QHS,MR X 1 Nira ConnBerry, Jason A, NP   50 mg at 04/08/17 2239  . triamcinolone cream (KENALOG) 0.1 %   Topical BID Nira ConnBerry, Jason A, NP        Lab Results: No results found for this or any previous visit (from the past 48 hour(s)).  Blood Alcohol level:  No results found for: [facility]  Metabolic Disorder Labs: Lab Results  Component Value Date   HGBA1C 5.4 04/04/2017   MPG 108.28 04/04/2017   No results found for: PROLACTIN Lab Results  Component Value Date   CHOL 198 04/05/2017   TRIG 109 04/05/2017   HDL 54 04/05/2017   CHOLHDL 3.7 04/05/2017   VLDL 22 04/05/2017   LDLCALC 122 (H) 04/05/2017   LDLCALC 92 04/04/2017    Physical Findings: AIMS: Facial and Oral Movements Muscles of Facial Expression: None, normal Lips and Perioral Area: None, normal Jaw: None, normal Tongue: None, normal,Extremity Movements Upper (arms, wrists, hands, fingers): None, normal Lower (legs, knees, ankles, toes): None, normal, Trunk Movements Neck, shoulders, hips: None, normal, Overall Severity Severity of abnormal movements (highest score from questions above): None, normal Incapacitation due to abnormal movements: None, normal Patient's awareness of abnormal movements (rate only patient's report): No Awareness, Dental Status Current problems with teeth and/or dentures?: No Does patient usually wear dentures?: No  CIWA:  CIWA-Ar Total: 1 COWS:  COWS Total Score:  1  Musculoskeletal: Strength & Muscle Tone: within normal limits Gait & Station: normal Patient leans: N/A  Psychiatric Specialty Exam: Physical Exam  Constitutional: He is oriented to person, place, and time. He appears well-developed and well-nourished.  HENT:  Head: Normocephalic and atraumatic.  Respiratory: Effort normal.  Neurological: He is alert and oriented to person, place, and time.  Psychiatric:  As above    ROS  Blood pressure 123/87, pulse 87, temperature 98.3 F (36.8 C), temperature source Oral, resp. rate 18, height 5\' 6"  (1.676  m), weight 84.8 kg (187 lb), SpO2 98 %.Body mass index is 30.18 kg/m.  General Appearance: Poor grooming. Has widespread skin lesions at various stages of healing. Very focused and preoccupied by these. Not responding to internal stimuli.   Eye Contact:  Fair  Speech:  Decreased rate and tone  Volume:  Decreased  Mood:  Overwhelmed by his subjective experience  Affect:  Blunted and mood congruent  Thought Process:  Linear  Orientation:  Full (Time, Place, and Person)  Thought Content:  Delusions of infestation and persecution. No preoccupation with violent thoughts.  No hallucination in any modality.   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 He is preoccupied by this delusion. No dangerousness. We discussed switch to a more potent anti-psyhcotic agent. We discussed use of  Haloperidol. He consented to treatment after we reviewed the risks and benefits. We can hopefully transition to LAI if he responds  Psychiatric: Delusional Disorder ,,,,, somatic type. SUD  Medical:  Psychosocial:   PLAN: 1. Discontinue Olanzapine 2. Haldol 5 mg BID. Would titrate as needed/tolerated 3. Continue to monitor mood, behavior and interaction with peers     Georgiann Cocker, MD 04/09/2017, 4:03 PM

## 2017-04-09 NOTE — Progress Notes (Signed)
Patient did attend the evening speaker NA meeting.  

## 2017-04-09 NOTE — Progress Notes (Signed)
Pt was up and visible on the unit this evening.  He has a skin irritation that looks like "bug bites" over most of his body.  He showed Clinical research associatewriter some new areas of irritation that presented today.  He was treated for the areas when he was admitted.  PA recommended that pt have another treatment of the cream that was used on admission and an order was given to have pt a Do Not Admit roommate.  Cream was applied and the pt that was in the room with the pt was moved to another room.  Pt denies SI/HI/AVH at this time.  He would like to discharge to that he can go back to work.  Support and encouragement offered.  Discharge plans are in process.  Safety maintained with q15 minute checks.

## 2017-04-09 NOTE — Progress Notes (Signed)
Adult Psychoeducational Group Note  Date:  04/09/2017 Time:  1:51 PM  Group Topic/Focus:  Wellness Toolbox:   The focus of this group is to discuss various aspects of wellness, balancing those aspects and exploring ways to increase the ability to experience wellness.  Patients will create a wellness toolbox for use upon discharge.  Participation Level:  Active  Participation Quality:  Appropriate  Affect:  Appropriate  Cognitive:  Alert and Appropriate  Insight: Appropriate, Good and Improving  Engagement in Group:  Engaged  Modes of Intervention:  Activity and Discussion  Additional Comments:  Pt participated in both group activity and discussion today.  Mark Benecke R Oneil Behney 04/09/2017, 1:51 PM

## 2017-04-09 NOTE — [facility] Group Notes (Signed)
Pt was alert and engaged in therapeutic television topic on vulnerability. 

## 2017-04-09 NOTE — Progress Notes (Signed)
Recreation Therapy Notes  Date: 04/09/17 Time: 0930 Location: [address] Dayroom  Group Topic: Stress Management  Goal Area(s) Addresses:  Patient will verbalize importance of using healthy stress management.  Patient will identify positive emotions associated with healthy stress management.   Intervention: Stress Management  Activity :  LRT introduced the stress management technique of guided imagery.  LRT read a script to guide patients to envision their peaceful place. Patients were to follow along as LRT read script.  Education:  Stress Management, Discharge Planning.   Education Outcome: Acknowledges edcuation/In group clarification offered/Needs additional education  Clinical Observations/Feedback: Pt did not attend group.    Caroll RancherMarjette Rhenda [place], LRT/CTRS    Lillia AbedLindsay, Len Azeez A 04/09/2017 11:30 AM

## 2017-04-09 NOTE — Tx Team (Signed)
Interdisciplinary Treatment and Diagnostic Plan Update  04/09/2017 Time of Session: 1610RU [place] MRN: [id number]  Principal Diagnosis: MDD, recurrent, severe, with psychotic features  Secondary Diagnoses: Principal Problem:   Severe recurrent major depression with psychotic features (HCC)   Current Medications:  Current Facility-Administered Medications  Medication Dose Route Frequency Provider Last Rate Last Dose  . acetaminophen (TYLENOL) tablet 650 mg  650 mg Oral Q6H PRN Nira Conn A, NP   650 mg at 04/08/17 1834  . albuterol (PROVENTIL HFA;VENTOLIN HFA) 108 (90 Base) MCG/ACT inhaler 1-2 puff  1-2 puff Inhalation Q6H PRN Nira Conn A, NP   2 puff at 04/09/17 [phone]  . alum & mag hydroxide-simeth (MAALOX/MYLANTA) 200-200-20 MG/5ML suspension 30 mL  30 mL Oral Q4H PRN Nira Conn A, NP      . hydrOXYzine (ATARAX/VISTARIL) tablet 25 mg  25 mg Oral Q6H PRN Nira Conn A, NP   25 mg at 04/08/17 1834  . loratadine (CLARITIN) tablet 10 mg  10 mg Oral Daily Nira Conn A, NP   10 mg at 04/09/17 0835  . magnesium hydroxide (MILK OF MAGNESIA) suspension 30 mL  30 mL Oral Daily PRN Nira Conn A, NP      . OLANZapine (ZYPREXA) tablet 10 mg  10 mg Oral QHS Cobos, Rockey Situ, MD   10 mg at 04/08/17 2127  . sertraline (ZOLOFT) tablet 50 mg  50 mg Oral Daily Cobos, Rockey Situ, MD   50 mg at 04/09/17 0835  . traZODone (DESYREL) tablet 50 mg  50 mg Oral QHS,MR X 1 Nira Conn A, NP   50 mg at 04/08/17 2239  . triamcinolone cream (KENALOG) 0.1 %   Topical BID Nira Conn A, NP       PTA Medications: Medications Prior to Admission  Medication Sig Dispense Refill Last Dose  . ibuprofen (ADVIL,MOTRIN) 800 MG tablet Take 800 mg by mouth 2 (two) times daily as needed (For pain.).   04/02/2017  . PRESCRIPTION MEDICATION Take 1 tablet by mouth daily. Depression medication   04/02/2017  . PRESCRIPTION MEDICATION Inhale 2 puffs into the lungs every 4 (four) hours as needed (For shortness of  breath.). Inhaler   04/03/2017  . triamcinolone ointment (KENALOG) 0.1 % Apply 1 application topically 2 (two) times daily. Applies to affected area.   04/01/2017    Patient Stressors: Educational concerns Financial difficulties Health problems  Patient Strengths: Capable of independent living Astronomer fund of knowledge  Treatment Modalities: Medication Management, Group therapy, Case management,  1 to 1 session with clinician, Psychoeducation, Recreational therapy.   Physician Treatment Plan for Primary Diagnosis: MDD, recurrent, severe, with psychotic features  Medication Management: Evaluate patient's response, side effects, and tolerance of medication regimen.  Therapeutic Interventions: 1 to 1 sessions, Unit Group sessions and Medication administration.  Evaluation of Outcomes: Progressing  Physician Treatment Plan for Secondary Diagnosis: Principal Problem:   Severe recurrent major depression with psychotic features (HCC)   Medication Management: Evaluate patient's response, side effects, and tolerance of medication regimen.  Therapeutic Interventions: 1 to 1 sessions, Unit Group sessions and Medication administration.  Evaluation of Outcomes: progressing  RN Treatment Plan for Primary Diagnosis: MDD, recurrent, severe, with psychotic features Long Term Goal(s): Knowledge of disease and therapeutic regimen to maintain health will improve  Short Term Goals: Ability to remain free from injury will improve, Ability to participate in decision making will improve and Ability to identify and develop effective coping behaviors will improve  Medication Management:  RN will administer medications as ordered by provider, will assess and evaluate patient's response and provide education to patient for prescribed medication. RN will report any adverse and/or side effects to prescribing provider.  Therapeutic Interventions: 1 on 1 counseling sessions, Psychoeducation,  Medication administration, Evaluate responses to treatment, Monitor vital signs and CBGs as ordered, Perform/monitor CIWA, COWS, AIMS and Fall Risk screenings as ordered, Perform wound care treatments as ordered.  Evaluation of Outcomes: progressing  LCSW Treatment Plan for Primary Diagnosis: MDD, recurrent, severe, with psychotic features Long Term Goal(s): Safe transition to appropriate next level of care at discharge, Engage patient in therapeutic group addressing interpersonal concerns.  Short Term Goals: Engage patient in aftercare planning with referrals and resources, Facilitate patient progression through stages of change regarding substance use diagnoses and concerns and Identify triggers associated with mental health/substance abuse issues  Therapeutic Interventions: Assess for all discharge needs, 1 to 1 time with Social worker, Explore available resources and support systems, Assess for adequacy in community support network, Educate family and significant other(s) on suicide prevention, Complete Psychosocial Assessment, Interpersonal group therapy.  Evaluation of Outcomes: Progressing  Progress in Treatment: Attending groups: Intermittently  Participating in groups: Yes, when he attends  Taking medication as prescribed: Yes. Toleration medication: Yes. Family/Significant other contact made: SPE completed with pt; pt declined to consent to collateral contact.  Patient understands diagnosis: Yes. Discussing patient identified problems/goals with staff: Yes. Medical problems stabilized or resolved: Yes. Denies suicidal/homicidal ideation: Yes. Issues/concerns per patient self-inventory: No. Other: n/a  New problem(s) identified: No, Describe:  n/a  New Short Term/Long Term Goal(s): detox, medication management for mood stabilization; elimination of SI thoughts; development of comprehensive mental wellness/sobriety plan.   Patient: "To get treatment to help me feel better."    Discharge Plan or Barriers: Pt plans to return home; [facility]. Pt provided with AA/NA list and MHAG pamphlet for additional community support.   Reason for Continuation of Hospitalization: Anxiety Depression Medication stabilization  Estimated Length of Stay: Thursday, 04/10/17  Attendees: Patient: 04/09/2017 10:02 AM  Physician: Dr. Jackquline BerlinIzediuno MD; Dr. Altamese Carolinaainville MD 04/09/2017 10:02 AM  Nursing: Armando ReichertMarian RN; Willow CreekPenny RN 04/09/2017 10:02 AM  RN Care Manager:  04/09/2017 10:02 AM  Social Worker: Chartered loss adjusterHeather Smart, LCSW 04/09/2017 10:02 AM  Recreational Therapist: x 04/09/2017 10:02 AM  Other: Armandina StammerAgnes Nwoko NP; Feliz Beamravis Money NP 04/09/2017 10:02 AM  Other:  04/09/2017 10:02 AM  Other: 04/09/2017 10:02 AM    Scribe for Treatment Team: Ledell PeoplesHeather N Smart, LCSW 04/09/2017 10:02 AM

## 2017-04-09 NOTE — [facility] Group Notes (Signed)
LCSW Group Therapy Note   04/09/2017 1:15pm   Type of Therapy and Topic:  Group Therapy:  Overcoming Obstacles   Participation Level:  Minimal   Description of Group:    In this group patients will be encouraged to explore what they see as obstacles to their own wellness and recovery. They will be guided to discuss their thoughts, feelings, and behaviors related to these obstacles. The group will process together ways to cope with barriers, with attention given to specific choices patients can make. Each patient will be challenged to identify changes they are motivated to make in order to overcome their obstacles. This group will be process-oriented, with patients participating in exploration of their own experiences as well as giving and receiving support and challenge from other group members.   Therapeutic Goals: 1. Patient will identify personal and current obstacles as they relate to admission. 2. Patient will identify barriers that currently interfere with their wellness or overcoming obstacles.  3. Patient will identify feelings, thought process and behaviors related to these barriers. 4. Patient will identify two changes they are willing to make to overcome these obstacles:      Summary of Patient Progress  Patient was minimally attentive and disengaged during today's processing group but did attend for it's entirety. He did not identify any specific obstacles and appeared disinterested when asked about his discharge plan. His primary focus to return home and to work. He agreed [facility] and was receptive to information about [facility].    Therapeutic Modalities:   Cognitive Behavioral Therapy Solution Focused Therapy Motivational Interviewing Relapse Prevention Therapy  Ledell PeoplesHeather N Smart, LCSW 04/09/2017 12:42 PM

## 2017-04-09 NOTE — Progress Notes (Signed)
Data. Patient denies SI/HI/AVH. Verbally contracts for safety on the unit and to come to staff before acting of any self harm thoughts/feelings.  Patient interacting well with staff and other patients. New bumps on neck and hands evaluated and MD notified.  Action. Emotional support and encouragement offered. Education provided on medication, indications and side effect. Q 15 minute checks done for safety. Response. Safety on the unit maintained through 15 minute checks.  Medications taken as prescribed. Attended groups. Remained calm and appropriate through out shift.

## 2017-04-10 DIAGNOSIS — F333 Major depressive disorder, recurrent, severe with psychotic symptoms: Secondary | ICD-10-CM | POA: Diagnosis present

## 2017-04-10 NOTE — Progress Notes (Signed)
Pt was not as talkative this evening as he was last evening.  He seems a little more drowsy.  He was started on Haldol today.  He states his day has been "ok".  He denies SI/HI/AVH.  He voiced no needs or concerns this evening.  He is compliant with meds and the cream for his skin.  Support and encouragement offered.  Discharge plans are in process.  He would like to discharge soon so that he can get back to work.  Safety maintained with q15 minute checks.

## 2017-04-10 NOTE — Progress Notes (Signed)
Pt did attend the evening wrap up group and was attentive, sharing, and supportive. Pt shared that he really enjoyed the pasta dinner tonight and that he is grateful for himself. Pt wishes his family wasn't so far away. Pt participated by sharing three loves for Valentine's day. Pt loves to eat vegetables, loves to make money, and loves to hear the birds chirping in the morning when a new day starts.

## 2017-04-10 NOTE — [facility] Group Notes (Signed)
LCSW Group Therapy Note  04/10/2017 1:15pm  Type of Therapy/Topic:  Group Therapy:  Feelings about Diagnosis  Participation Level:  Minimal   Description of Group:   This group will allow patients to explore their thoughts and feelings about diagnoses they have received. Patients will be guided to explore their level of understanding and acceptance of these diagnoses. Facilitator will encourage patients to process their thoughts and feelings about the reactions of others to their diagnosis and will guide patients in identifying ways to discuss their diagnosis with significant others in their lives. This group will be process-oriented, with patients participating in exploration of their own experiences, giving and receiving support, and processing challenge from other group members.   Therapeutic Goals: 1. Patient will demonstrate understanding of diagnosis as evidenced by identifying two or more symptoms of the disorder 2. Patient will be able to express two feelings regarding the diagnosis 3. Patient will demonstrate their ability to communicate their needs through discussion and/or role play  Summary of Patient Progress:  [name] was attentive during group and actively listened as other shared. He mentioned that he was ready to return to work and is feeling better today. Pt also talked about a friend that died 7 years ago by falling 20 feet after getting high. "he's been on my mind today." pt was unable to remain on topic but was pleasant and redirectable.   Therapeutic Modalities:   Cognitive Behavioral Therapy Brief Therapy Feelings Identification    Ledell PeoplesHeather N Smart, LCSW 04/10/2017 12:48 PM

## 2017-04-10 NOTE — Progress Notes (Signed)
[facility]  04/10/2017 11:47 AM Macie BurowsLuis Franco Mario  MRN:  [id number]   Subjective:  Patient reports that he is feeling better today and the new medication made him sleepy. He reports good sleep and appetite. He denies any SI/HI/AVH and contracts for safety.    Objective: Patient's chart and findings reviewed and discussed with treatment team. Patient presents in his bed and is asleep and when his name is called out he says his own name out loud. He then immediately starts looking at his arms and checking them over. Patient is pleasant and cooperative. Will continue current medications.   Principal Problem: Delusional disorder [facility](HCC) Diagnosis:   Patient Active Problem List   Diagnosis Date Noted  . Severe recurrent major depression with psychotic features (HCC) [F33.3]   . Substance use disorder [F19.90] 04/09/2017  . Delusional disorder (HCC) [F22] 04/03/2017   Total Time spent with patient: 15 minutes  Past Psychiatric History: See H&P  Past Medical History:  Past Medical History:  Diagnosis Date  . Asthma     Past Surgical History:  Procedure Laterality Date  . CHOLECYSTECTOMY     Family History: History reviewed. No pertinent family history. Family Psychiatric  History: See H&P Social History:  Social History   Substance and Sexual Activity  Alcohol Use No  . Frequency: Never     Social History   Substance and Sexual Activity  Drug Use Yes  . Types: Amphetamines, Cocaine    Social History   Socioeconomic History  . Marital status: Legally Separated    Spouse name: None  . Number of children: None  . Years of education: None  . Highest education level: None  Social Needs  . Financial resource strain: None  . Food insecurity - worry: None  . Food insecurity - inability: None  . Transportation needs - medical: None  . Transportation needs - non-medical: None  Occupational History  . None  Tobacco Use  . Smoking status: Former Games developermoker  .  Smokeless tobacco: Never Used  Substance and Sexual Activity  . Alcohol use: No    Frequency: Never  . Drug use: Yes    Types: Amphetamines, Cocaine  . Sexual activity: None  Other Topics Concern  . None  Social History Narrative  . None   Additional Social History:    Pain Medications: See MAR Prescriptions: See MAR Over the Counter: See MAR History of alcohol / drug use?: Yes Longest period of sobriety (when/how long): Ukn Name of Substance 1: Cocaine 1 - Age of First Use: ukn 1 - Amount (size/oz): ukn 1 - Frequency: ukn 1 - Duration: ukn 1 - Last Use / Amount: tested positive in ER three days ago but [facility] Name of Substance 2: Amphetimines 2 - Age of First Use: ukn 2 - Amount (size/oz): ukn 2 - Frequency: ukn 2 - Duration: ukn 2 - Last Use / Amount: tested positive in ER three days ago but [facility]                Sleep: Good  Appetite:  Good  Current Medications: Current Facility-Administered Medications  Medication Dose Route Frequency Provider Last Rate Last Dose  . acetaminophen (TYLENOL) tablet 650 mg  650 mg Oral Q6H PRN Nira ConnBerry, Jason A, NP   650 mg at 04/09/17 1503  . albuterol (PROVENTIL HFA;VENTOLIN HFA) 108 (90 Base) MCG/ACT inhaler 1-2 puff  1-2 puff Inhalation Q6H PRN Jackelyn PolingBerry, Jason A, NP  2 puff at 04/10/17 0637  . alum & mag hydroxide-simeth (MAALOX/MYLANTA) 200-200-20 MG/5ML suspension 30 mL  30 mL Oral Q4H PRN Nira Conn A, NP      . haloperidol (HALDOL) tablet 5 mg  5 mg Oral BID Izediuno, Delight Ovens, MD   5 mg at 04/10/17 0836  . hydrOXYzine (ATARAX/VISTARIL) tablet 25 mg  25 mg Oral Q6H PRN Nira Conn A, NP   25 mg at 04/09/17 2139  . loratadine (CLARITIN) tablet 10 mg  10 mg Oral Daily Nira Conn A, NP   10 mg at 04/10/17 0836  . magnesium hydroxide (MILK OF MAGNESIA) suspension 30 mL  30 mL Oral Daily PRN Nira Conn A, NP      . sertraline (ZOLOFT) tablet 50 mg  50 mg Oral Daily Cobos, Rockey Situ, MD   50 mg at  04/10/17 0836  . traZODone (DESYREL) tablet 50 mg  50 mg Oral QHS,MR X 1 Nira Conn A, NP   50 mg at 04/09/17 2139  . triamcinolone cream (KENALOG) 0.1 %   Topical BID Nira Conn A, NP        Lab Results: No results found for this or any previous visit (from the past 48 hour(s)).  Blood Alcohol level:  No results found for: [facility]  Metabolic Disorder Labs: Lab Results  Component Value Date   HGBA1C 5.4 04/04/2017   MPG 108.28 04/04/2017   No results found for: PROLACTIN Lab Results  Component Value Date   CHOL 198 04/05/2017   TRIG 109 04/05/2017   HDL 54 04/05/2017   CHOLHDL 3.7 04/05/2017   VLDL 22 04/05/2017   LDLCALC 122 (H) 04/05/2017   LDLCALC 92 04/04/2017    Physical Findings: AIMS: Facial and Oral Movements Muscles of Facial Expression: None, normal Lips and Perioral Area: None, normal Jaw: None, normal Tongue: None, normal,Extremity Movements Upper (arms, wrists, hands, fingers): None, normal Lower (legs, knees, ankles, toes): None, normal, Trunk Movements Neck, shoulders, hips: None, normal, Overall Severity Severity of abnormal movements (highest score from questions above): None, normal Incapacitation due to abnormal movements: None, normal Patient's awareness of abnormal movements (rate only patient's report): No Awareness, Dental Status Current problems with teeth and/or dentures?: No Does patient usually wear dentures?: No  CIWA:  CIWA-Ar Total: 1 COWS:  COWS Total Score: 1  Musculoskeletal: Strength & Muscle Tone: within normal limits Gait & Station: normal Patient leans: N/A  Psychiatric Specialty Exam: Physical Exam  Nursing note and vitals reviewed. Constitutional: He is oriented to person, place, and time. He appears well-developed and well-nourished.  Cardiovascular: Normal rate.  Respiratory: Effort normal.  Musculoskeletal: Normal range of motion.  Neurological: He is alert and oriented to person, place, and time.  Skin: Skin is warm.     Review of Systems  Constitutional: Negative.   HENT: Negative.   Eyes: Negative.   Respiratory: Negative.   Cardiovascular: Negative.   Gastrointestinal: Negative.   Genitourinary: Negative.   Musculoskeletal: Negative.   Skin: Negative.   Neurological: Negative.   Endo/Heme/Allergies: Negative.   Psychiatric/Behavioral: The patient is nervous/anxious.     Blood pressure (!) 126/91, pulse 79, temperature 97.6 F (36.4 C), temperature source Oral, resp. rate 16, height 5\' 6"  (1.676 m), weight 84.8 kg (187 lb), SpO2 98 %.Body mass index is 30.18 kg/m.  General Appearance: Casual  Eye Contact:  Good  Speech:  Clear and Coherent and Normal Rate  Volume:  Normal  Mood:  Euthymic  Affect:  Congruent  Thought  Process:  Goal Directed and Descriptions of Associations: Intact  Orientation:  Full (Time, Place, and Person)  Thought Content:  Delusions  Suicidal Thoughts:  No  Homicidal Thoughts:  No  Memory:  Immediate;   Good Recent;   Good Remote;   Good  Judgement:  Good  Insight:  Good  Psychomotor Activity:  Normal  Concentration:  Concentration: Good and Attention Span: Good  Recall:  Good  Fund of Knowledge:  Good  Language:  Good  Akathisia:  No  Handed:  Right  AIMS (if indicated):     Assets:  Communication Skills Desire for Improvement Financial Resources/Insurance Housing Physical Health Social Support Transportation  ADL's:  Intact  Cognition:  WNL  Sleep:  Number of Hours: 4.75   Problems Addressed: Delusional Disorder SUD MDD severe  Treatment Plan Summary: Daily contact with patient to assess and evaluate symptoms and progress in treatment, Medication management and Plan is to:  -Continue Zoloft 50 mg PO Daily for mood stability -Continue Haldol 5 mg PO BID for mood stability -Continue Vistaril 25 mg PO Q6H PRN for anxiety -Continue Trazodone 50 mg PO QHS PRN for insomnia -Encourage group therapy participation   Maryfrances Bunnell,  FNP 04/10/2017, 11:47 AM

## 2017-04-10 NOTE — Plan of Care (Signed)
Pt has a flat affect and continues to isolate in his room.

## 2017-04-10 NOTE — Progress Notes (Signed)
D:Pt c/o feeling sleepy this morning. He got up to take his morning scheduled medications and then went back to bed.  A:Offered support and 15 minute checks.  R:Pt denies si and hi. No other concerns this morning besides feeling sleepy. Safety maintained on the unit.

## 2017-04-11 DIAGNOSIS — F22 Delusional disorders: Secondary | ICD-10-CM

## 2017-04-11 MED ORDER — HALOPERIDOL DECANOATE 100 MG/ML IM SOLN
200.0000 mg | INTRAMUSCULAR | Status: DC
  Administered 2017-04-11: 200 mg via INTRAMUSCULAR
  Filled 2017-04-11 (×2): qty 2

## 2017-04-11 NOTE — Progress Notes (Signed)
Recreation Therapy Notes  Date: 04/11/17 Time: 0930 Location: 300 Hall Dayroom  Group Topic: Stress Management  Goal Area(s) Addresses:  Patient will verbalize importance of using healthy stress management.  Patient will identify positive emotions associated with healthy stress management.   Intervention: Stress Management  Activity :  Body Scan Meditation.  LRT introduced the stress management technique of meditation.  LRT played a meditation from the Calm app that allowed patients to take inventory of any sensations or tension they may have been experiencing.  Education:  Stress Management, Discharge Planning.   Education Outcome: Acknowledges edcuation/In group clarification offered/Needs additional education  Clinical Observations/Feedback: Pt did not attend group.    Idora Brosious, LRT/CTRS         Parris Cudworth A 04/11/2017 12:53 PM 

## 2017-04-11 NOTE — [facility] Group Notes (Signed)
[facility] Group Notes:  (Nursing/MHT/Case Management/Adjunct)  Date:  04/11/2017  Time:  6:53 PM  Type of Therapy:  Nurse Education  Participation Level:  Active  Participation Quality:  Appropriate  Affect:  Appropriate  Cognitive:  Appropriate  Insight:  Appropriate  Engagement in Group:  Engaged  Modes of Intervention:  Discussion and Education  Summary of Progress/Problems:  Nurse discussed the SMART goal setting guide for coping skills and relapse prevention. Nurse also discussed the Relapse Prevention Plan Worksheet and encouraged all patients to fill the worksheet out, individually, thoughtfully and with the goal of staying heathy.   Almira Barenny G Keleigh Kazee 04/11/2017, 6:53 PM

## 2017-04-11 NOTE — Progress Notes (Signed)
Psychoeducational Group Note  Date:  04/11/2017 Time:  1000  Group Topic/Focus:  Recovery Goals:   The focus of this group is to identify appropriate goals for recovery and establish a plan to achieve them.  Participation Level: Did Not Attend  Participation Quality:  Not Applicable  Affect:  Not Applicable  Cognitive:  Not Applicable  Insight:  Not Applicable  Engagement in Group: Not Applicable  Additional Comments:  Pt didn't attend group  Gwenevere Ghazili, Joah Patlan Patience 04/11/2017, 3:04 PM

## 2017-04-11 NOTE — Progress Notes (Signed)
[facility]  04/11/2017 3:49 PM Jesus BurowsLuis [name] Marcella  MRN:  [id number] Subjective:   42 y.o Caucasian male, separated, lives with a room mate. Background history of SUD and mood disorder. Presented to the ER via EMS. Reported feeling of bug infestation. Had seen a dermatologist who referred him back to psychiatry. History of stimulant use. Positive for cocaine and amphetamines.   Chart reviewed today. Patient discussed at team today.  Staff reports that he has been more interactive. He is less preoccupied with somatic delusion. He has not voiced any futility thoughts lately. SW has been coordinating aftercare with him  Seen today. States that he is feeling better. Feels the rash is clearing up. No longer having feelings of insects crawling over him. Says he is tolerating his medication well. He consented to [facility] formulary. No suicidal thoughts. No thoughts of violence. No homicidal thoughts. Hopes to be back home this weekend   Principal Problem:  Delusional disorder                                   Substance Induced Mood Disorder Diagnosis:   Patient Active Problem List   Diagnosis Date Noted  . Severe recurrent major depression with psychotic features (HCC) [F33.3]   . Substance use disorder [F19.90] 04/09/2017  . Delusional disorder (HCC) [F22] 04/03/2017   Total Time spent with patient: 20 minutes  Past Psychiatric History: As in H&P  Past Medical History:  Past Medical History:  Diagnosis Date  . Asthma     Past Surgical History:  Procedure Laterality Date  . CHOLECYSTECTOMY     Family History: History reviewed. No pertinent family history. Family Psychiatric  History: As in H&P Social History:  Social History   Substance and Sexual Activity  Alcohol Use No  . Frequency: Never     Social History   Substance and Sexual Activity  Drug Use Yes  . Types: Amphetamines, Cocaine    Social History   Socioeconomic History  . Marital status: Legally Separated     Spouse name: None  . Number of children: None  . Years of education: None  . Highest education level: None  Social Needs  . Financial resource strain: None  . Food insecurity - worry: None  . Food insecurity - inability: None  . Transportation needs - medical: None  . Transportation needs - non-medical: None  Occupational History  . None  Tobacco Use  . Smoking status: Former Games developermoker  . Smokeless tobacco: Never Used  Substance and Sexual Activity  . Alcohol use: No    Frequency: Never  . Drug use: Yes    Types: Amphetamines, Cocaine  . Sexual activity: None  Other Topics Concern  . None  Social History Narrative  . None   Additional Social History:    Pain Medications: See MAR Prescriptions: See MAR Over the Counter: See MAR History of alcohol / drug use?: Yes Longest period of sobriety (when/how long): Ukn Name of Substance 1: Cocaine 1 - Age of First Use: ukn 1 - Amount (size/oz): ukn 1 - Frequency: ukn 1 - Duration: ukn 1 - Last Use / Amount: tested positive in ER three days ago but [facility] Name of Substance 2: Amphetimines 2 - Age of First Use: ukn 2 - Amount (size/oz): ukn 2 - Frequency: ukn 2 - Duration: ukn 2 - Last Use / Amount: tested positive in ER  three days ago but denies today at [facility]                Sleep: Good  Appetite:  Good  Current Medications: Current Facility-Administered Medications  Medication Dose Route Frequency Provider Last Rate Last Dose  . acetaminophen (TYLENOL) tablet 650 mg  650 mg Oral Q6H PRN Nira Conn A, NP   650 mg at 04/09/17 1503  . albuterol (PROVENTIL HFA;VENTOLIN HFA) 108 (90 Base) MCG/ACT inhaler 1-2 puff  1-2 puff Inhalation Q6H PRN Nira Conn A, NP   2 puff at 04/11/17 1304  . alum & mag hydroxide-simeth (MAALOX/MYLANTA) 200-200-20 MG/5ML suspension 30 mL  30 mL Oral Q4H PRN Nira Conn A, NP      . haloperidol (HALDOL) tablet 5 mg  5 mg Oral BID Izediuno, Delight Ovens, MD   5 mg at 04/11/17  0845  . hydrOXYzine (ATARAX/VISTARIL) tablet 25 mg  25 mg Oral Q6H PRN Nira Conn A, NP   25 mg at 04/09/17 2139  . loratadine (CLARITIN) tablet 10 mg  10 mg Oral Daily Nira Conn A, NP   10 mg at 04/11/17 0845  . magnesium hydroxide (MILK OF MAGNESIA) suspension 30 mL  30 mL Oral Daily PRN Nira Conn A, NP      . sertraline (ZOLOFT) tablet 50 mg  50 mg Oral Daily Cobos, Rockey Situ, MD   50 mg at 04/11/17 0846  . traZODone (DESYREL) tablet 50 mg  50 mg Oral QHS,MR X 1 Nira Conn A, NP   50 mg at 04/09/17 2139  . triamcinolone cream (KENALOG) 0.1 %   Topical BID Nira Conn A, NP        Lab Results: No results found for this or any previous visit (from the past 48 hour(s)).  Blood Alcohol level:  No results found for: [facility]  Metabolic Disorder Labs: Lab Results  Component Value Date   HGBA1C 5.4 04/04/2017   MPG 108.28 04/04/2017   No results found for: PROLACTIN Lab Results  Component Value Date   CHOL 198 04/05/2017   TRIG 109 04/05/2017   HDL 54 04/05/2017   CHOLHDL 3.7 04/05/2017   VLDL 22 04/05/2017   LDLCALC 122 (H) 04/05/2017   LDLCALC 92 04/04/2017    Physical Findings: AIMS: Facial and Oral Movements Muscles of Facial Expression: None, normal Lips and Perioral Area: None, normal Jaw: None, normal Tongue: None, normal,Extremity Movements Upper (arms, wrists, hands, fingers): None, normal Lower (legs, knees, ankles, toes): None, normal, Trunk Movements Neck, shoulders, hips: None, normal, Overall Severity Severity of abnormal movements (highest score from questions above): None, normal Incapacitation due to abnormal movements: None, normal Patient's awareness of abnormal movements (rate only patient's report): No Awareness, Dental Status Current problems with teeth and/or dentures?: No Does patient usually wear dentures?: No  CIWA:  CIWA-Ar Total: 1 COWS:  COWS Total Score: 1  Musculoskeletal: Strength & Muscle Tone: within normal limits Gait & Station:  normal Patient leans: N/A  Psychiatric Specialty Exam: Physical Exam  Constitutional: He is oriented to person, place, and time. He appears well-developed and well-nourished.  HENT:  Head: Normocephalic and atraumatic.  Respiratory: Effort normal.  Neurological: He is alert and oriented to person, place, and time.  Psychiatric:  As above    ROS  Blood pressure 131/86, pulse 63, temperature 97.8 F (36.6 C), resp. rate 16, height 5\' 6"  (1.676 m), weight 84.8 kg (187 lb), SpO2 98 %.Body mass index is 30.18 kg/m.  General Appearance: Relatively calmer.  More engaging.  Eye Contact:  Better   Speech: More spontaneous.   Volume:  Normal   Mood:  Feels better  Affect:  Brighter   Thought Process:  Linear  Orientation:  Full (Time, Place, and Person)  Thought Content:  Delusions are less intense. No preoccupation with violent thoughts.  No hallucination in any modality.   Suicidal Thoughts:  No  Homicidal Thoughts:  No  Memory:  Immediate;   Good Recent;   Good Remote;   Good  Judgement:  Good  Insight:  Better    Concentration:  Better   Recall:  Good  Fund of Knowledge:  Fair  Language:  Good  Akathisia:  Negative  Handed:    AIMS (if indicated):     Assets:  Desire for Improvement Housing Resilience  ADL's:  Fair  Cognition:  WNL  Sleep:  Number of Hours: (P) 6.25     Treatment Plan Summary:  Delusion and parasitosis are less intense. He is tolerating Haloperidol well. We discussed LAI. He consented to treatment after we reviewed the risks and benefits. We plan to evaluate him overnight. Hopeful discharge tomorrow.   Psychiatric: Delusional Disorder ,,,,, somatic type. SUD  Medical:  Psychosocial:   PLAN: 1. Haldol Decanoate 200 mg monthly 2. Continue oral Haldol for six days 3. Continue to monitor mood, behavior and interaction with peers     Georgiann Cocker, MD 04/11/2017, 3:49 PMPatient ID: [name], male   DOB: 01-23-1976, 42 y.o.    MRN: [id number]

## 2017-04-11 NOTE — Plan of Care (Signed)
Nurse discussed depression, anxiety, coping skills with patient.  

## 2017-04-11 NOTE — [facility] Group Notes (Signed)
LCSW Group Therapy Note  04/11/2017 1:15pm  Type of Therapy and Topic:  Group Therapy:  Feelings around Relapse and Recovery  Participation Level:  Active   Description of Group:    Patients in this group will discuss emotions they experience before and after a relapse. They will process how experiencing these feelings, or avoidance of experiencing them, relates to having a relapse. Facilitator will guide patients to explore emotions they have related to recovery. Patients will be encouraged to process which emotions are more powerful. They will be guided to discuss the emotional reaction significant others in their lives may have to their relapse or recovery. Patients will be assisted in exploring ways to respond to the emotions of others without this contributing to a relapse.  Therapeutic Goals: 1. Patient will identify two or more emotions that lead to a relapse for them 2. Patient will identify two emotions that result when they relapse 3. Patient will identify two emotions related to recovery 4. Patient will demonstrate ability to communicate their needs through discussion and/or role plays   Summary of Patient Progress:  [name] was attentive and engaged during today's processing group. He shared that he learned that "drugs are not good for me and I need to stop." [name] continues to make some bizarre statement unrelated to group topic but remains pleasant and redirectable. [name] continues to make progress in the group setting with improving insight.    Therapeutic Modalities:   Cognitive Behavioral Therapy Solution-Focused Therapy Assertiveness Training Relapse Prevention Therapy   Ledell PeoplesHeather N Smart, LCSW 04/11/2017 11:34 AM

## 2017-04-11 NOTE — Progress Notes (Signed)
Patient ID: [name], male   DOB: 1975-12-19, 42 y.o.   MRN: [id number]  Pt currently presents with a flat affect and cooperative behavior. Pt states "I'm feeling better now with the medicines." Reports decreased itching and irritation from rash. Pt reports good sleep with current medication regimen.   Pt provided with medications per providers orders. Pt's labs and vitals were monitored throughout the night. Pt given a 1:1 about emotional and mental status. Pt supported and encouraged to express concerns and questions. Pt educated on medications.  Pt's safety ensured with 15 minute and environmental checks. Pt currently denies SI/HI and A/V hallucinations. Pt verbally agrees to seek staff if SI/HI or A/VH occurs and to consult with staff before acting on any harmful thoughts. Will continue POC.

## 2017-04-11 NOTE — Progress Notes (Signed)
Pt has been somewhat lethargic this evening, but he did attend evening group.  He reports that his day has been ok, but the previous RN reported that he was in bed a lot today.  He denies SI/HI/AVH at this time.  He voices no needs or concerns.  He was scheduled cream for the areas on his skin and Trazodone for sleep, but he went to bed after group and went to sleep, so the meds were documented not given.  Support and encouragement offered.  Discharge plans are in process.  Pt wants to return home so he can get back to his job.  Safety maintained with q15 minute checks.

## 2017-04-11 NOTE — Progress Notes (Signed)
D:  Patient denied SI and HI, contracts for safety.  Denied A/V hallucinations.  Denied pain. A:  Medications administered per MD orders.  Emotional support and encouragement given patient. R:  Safety maintained with 15 minute checks.  

## 2017-04-12 DIAGNOSIS — F1910 Other psychoactive substance abuse, uncomplicated: Secondary | ICD-10-CM

## 2017-04-12 MED ORDER — HALOPERIDOL 5 MG PO TABS: 5.0000 mg | ORAL_TABLET | Freq: Two times a day (BID) | ORAL | 0 refills | Status: DC

## 2017-04-12 MED ORDER — SERTRALINE HCL 50 MG PO TABS: 50.0000 mg | ORAL_TABLET | Freq: Every day | ORAL | 0 refills | Status: DC

## 2017-04-12 MED ORDER — TRAZODONE HCL 50 MG PO TABS: 50.0000 mg | ORAL_TABLET | Freq: Every evening | ORAL | 0 refills | Status: DC | PRN

## 2017-04-12 NOTE — Progress Notes (Signed)
  [facility] :  Will you be returning to the same living situation after discharge:  Yes,  rooming house At discharge, do you have transportation home?: Yes,  bus pass Do you have the ability to pay for your medications:   Yes, states there are no barriers  Release of information consent forms completed and turned in to Medical Records by CSW.  Patient to Follow up at: Follow-up Information    [facility] Follow up on 04/18/2017.   Specialty:  Behavioral Health Why:  Hospital follow-up on Friday, 2/22 at 8:00AM. Please bring: photo Id, social security card, any proof of household income, and hospital discharge paperwork. Thank you.  Contact information: [address] [place] [id number] [phone]           Next level of care provider [facility]:no  Safety Planning and Suicide Prevention discussed: Yes,  with patient only in detail, declined with others  Have you used any form of tobacco in the last 30 days? (Cigarettes, Smokeless Tobacco, Cigars, and/or Pipes): No  Has patient been referred to the Quitline?: N/A patient is not a smoker  Patient has been referred for addiction treatment: Yes  Lynnell ChadMareida J Grossman-Orr, LCSW 04/12/2017, 2:26 PM

## 2017-04-12 NOTE — Progress Notes (Signed)
Pt is prepared for dc as Clinical research associatewriter reviewed dc instructions with pt. HE was given cc of these instrucitons ( AVS, SRA, SP and transition record), as well as prescriptions for his medications. He was encouraged to keep his appts and his triamcinolone and albuetrol in his med drawere were given to him also. He completed his daily assessment and on this he wrote he deneid SI today and he rated his depression, hopelessness and anxeity " 6/6/6/", respectively. He was escorted to bldg entrance and dc'd per MD order.
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
## 2017-04-12 NOTE — BHH Group Notes (Signed)
Orientation /  Goals   Date:  04/12/2017  Time:  5:51 PM  Type of Therapy:  Nurse Education  /  The group focuses on teaching patients who their staff is, what the staff responsibilities are and what the uit programming / scheduling looks like.  Participation Level:  Active  Participation Quality:  Attentive  Affect:  Appropriate  Cognitive:  Alert  Insight:  Appropriate  Engagement in Group:  Engaged  Modes of Intervention:  Education  Summary of Progress/Problems:  Rich BraveDuke, Natausha Jungwirth Lynn 04/12/2017, 5:51 PM

## 2017-04-12 NOTE — [facility] Suicide Risk Assessment (Signed)
[facility]   Principal Problem: Delusional disorder [facility](HCC) Discharge Diagnoses:  Patient Active Problem List   Diagnosis Date Noted  . Severe recurrent major depression with psychotic features ([facility]) [F33.3]   . Substance use disorder [F19.90] 04/09/2017  . Delusional disorder ([facility]) [F22] 04/03/2017    Total Time spent with patient: 45 minutes  Musculoskeletal: Strength & Muscle Tone: within normal limits Gait & Station: normal Patient leans: N/A  Psychiatric Specialty Exam: Review of Systems  Constitutional: Negative.   HENT: Negative.   Eyes: Negative.   Respiratory: Negative.   Cardiovascular: Negative.   Genitourinary: Negative.   Musculoskeletal: Negative.   Skin:       No new rashes.   Neurological: Negative.   Endo/Heme/Allergies: Negative.   Psychiatric/Behavioral: Negative for depression, hallucinations, memory loss, substance abuse and suicidal ideas. The patient is not nervous/anxious and does not have insomnia.     Blood pressure 131/86, pulse 63, temperature 97.8 F (36.6 C), resp. rate 16, height 5\' 6"  (1.676 m), weight 84.8 kg (187 lb), SpO2 98 %.Body mass index is 30.18 kg/m.  General Appearance: Casually dressed, no new skin excoriations. Less preoccupation with skin. Pleasant and engaged well. Appropriate and does not appear internally distracted.   Eye Contact::  Good  Speech:  Normal rate and tone  Volume:  Normal  Mood:  Euthymic  Affect:  Appropriate and Restricted  Thought Process:  Linear  Orientation:  Full (Time, Place, and Person)  Thought Content:  Delusions are at the back burner. Future oriented No preoccupation with violent thoughts. No negative ruminations. No obsession.  No hallucination in any modality.   Suicidal Thoughts:  No  Homicidal Thoughts:  No  Memory:  Immediate;   Good Recent;   Good Remote;   Good  Judgement:  Good  Insight:  Good  Psychomotor Activity:  Normal  Concentration:  Good  Recall:   Good  Fund of Knowledge:Good  Language: Good  Akathisia:  Negative  Handed:    AIMS (if indicated):     Assets:  Desire for Improvement Financial Resources/Insurance Housing Resilience  Sleep:  Number of Hours: 6.25  Cognition: WNL  ADL's:  Intact   Clinical Assessment::   42 y.o Caucasian male, separated, lives with a room mate. Background history of SUD and mood disorder. Presented to the ER via EMS. Reported feeling of bug infestation. Had seen a dermatologist who referred him back to psychiatry. History of stimulant use. Positive for cocaine and amphetamines.   Seen today. Had his depot yesterday. He is tolerating it well. Much less preoccupied with his somatic issues. No formication lately. Reports that he is in good spirits. Not feeling depressed. Reports normal energy and interest. Has been maintaining normal biological functions. He is able to think clearly. He is able to focus on task. His thoughts are not crowded or racing. No evidence of mania. No hallucination in any modality. He is not making any delusional statement. No passivity of will/thought. He is fully in touch with reality. No thoughts of suicide. No thoughts of homicide. No violent thoughts. No overwhelming anxiety. No access to weapons.  Nursing staff reports that patient has been appropriate on the unit. Patient has been interacting well with peers. No behavioral issues. Patient has not voiced any suicidal thoughts. Patient has not been observed to be internally stimulated. Patient has been adherent with treatment recommendations. Patient has been tolerating their medication well.   Patient was discussed at team. Team members feels that  patient is back to his baseline level of function. Team agrees with plan to discharge patient today.   Demographic Factors:  Divorced or widowed  Loss Factors: NA  Historical Factors: Family history of mental illness or substance abuse and Impulsivity  Risk Reduction Factors:    Living with another person, especially a relative, Positive social support, Positive therapeutic relationship and Positive coping skills or problem solving skills  Continued Clinical Symptoms:  As above   Cognitive Features That Contribute To Risk:  None    Suicide Risk:  Minimal: No identifiable suicidal ideation.  Patient is not having any thoughts of suicide at this time. Modifiable risk factors targeted during this admission includes delusional disorder and substance use. Demographical and historical risk factors cannot be modified. Patient is now engaging well. Patient is reliable and is future oriented. We have buffered patient's support structures. At this point, patient is at low risk of suicide. Patient is aware of the effects of psychoactive substances on decision making process. Patient has been provided with emergency contacts. Patient acknowledges to use resources provided if unforseen circumstances changes their current risk stratification.    Follow-up Information    [facility] Follow up on 04/18/2017.   Specialty:  Behavioral Health Why:  Hospital follow-up on Friday, 2/22 at 8:00AM. Please bring: photo Id, social security card, any proof of household income, and hospital discharge paperwork. Thank you.  Contact information: [address] [place] [address] [phone]           Plan Of Care/Follow-up recommendations:  1. Continue current psychotropic medications 2. Mental health and addiction follow up as arranged.  3. Provided limited quantity of prescriptions   Georgiann Cocker, MD 04/12/2017, 10:06 AM

## 2017-05-30 ENCOUNTER — Emergency Department (HOSPITAL_COMMUNITY): Payer: Self-pay

## 2017-05-30 ENCOUNTER — Encounter (HOSPITAL_COMMUNITY): Payer: Self-pay | Admitting: Internal Medicine

## 2017-05-30 ENCOUNTER — Emergency Department (HOSPITAL_COMMUNITY)
Admission: EM | Admit: 2017-05-30 | Discharge: 2017-05-30 | Disposition: A | Discharge status: Home / Self Care | Payer: Self-pay | Attending: Physician Assistant | Admitting: Physician Assistant

## 2017-05-30 DIAGNOSIS — J45901 Unspecified asthma with (acute) exacerbation: Secondary | ICD-10-CM | POA: Insufficient documentation

## 2017-05-30 DIAGNOSIS — Z87891 Personal history of nicotine dependence: Secondary | ICD-10-CM | POA: Insufficient documentation

## 2017-05-30 DIAGNOSIS — R112 Nausea with vomiting, unspecified: Secondary | ICD-10-CM | POA: Insufficient documentation

## 2017-05-30 LAB — CBC WITH DIFFERENTIAL/PLATELET
BASOS ABS: 0.1 10*3/uL (ref 0.0–0.1)
Basophils Relative: 1 %
Eosinophils Absolute: 1.2 10*3/uL — ABNORMAL HIGH (ref 0.0–0.7)
Eosinophils Relative: 18 %
HCT: 42.6 % (ref 39.0–52.0)
Hemoglobin: 14.6 g/dL (ref 13.0–17.0)
LYMPHS ABS: 1.8 10*3/uL (ref 0.7–4.0)
Lymphocytes Relative: 27 %
MCH: 32.4 pg (ref 26.0–34.0)
MCHC: 34.3 g/dL (ref 30.0–36.0)
MCV: 94.5 fL (ref 78.0–100.0)
MONO ABS: 0.4 10*3/uL (ref 0.1–1.0)
MONOS PCT: 6 %
NEUTROS ABS: 3.2 10*3/uL (ref 1.7–7.7)
Neutrophils Relative %: 48 %
Platelets: 253 10*3/uL (ref 150–400)
RBC: 4.51 MIL/uL (ref 4.22–5.81)
RDW: 13.1 % (ref 11.5–15.5)
WBC: 6.6 10*3/uL (ref 4.0–10.5)

## 2017-05-30 LAB — COMPREHENSIVE METABOLIC PANEL
ALT: 51 U/L (ref 17–63)
AST: 37 U/L (ref 15–41)
Albumin: 4 g/dL (ref 3.5–5.0)
Alkaline Phosphatase: 84 U/L (ref 38–126)
Anion gap: 5 (ref 5–15)
BUN: 12 mg/dL (ref 6–20)
CO2: 25 mmol/L (ref 22–32)
CREATININE: 0.73 mg/dL (ref 0.61–1.24)
Calcium: 9.4 mg/dL (ref 8.9–10.3)
Chloride: 109 mmol/L (ref 101–111)
GFR calc Af Amer: >60 mL/min (ref >60)
GFR calc non Af Amer: >60 mL/min (ref >60)
Glucose, Bld: 127 mg/dL — ABNORMAL HIGH (ref 65–99)
POTASSIUM: 3.5 mmol/L (ref 3.5–5.1)
Sodium: 139 mmol/L (ref 135–145)
TOTAL PROTEIN: 7.1 g/dL (ref 6.5–8.1)
Total Bilirubin: 1.1 mg/dL (ref 0.3–1.2)

## 2017-05-30 MED ORDER — PREDNISONE 20 MG PO TABS: ORAL_TABLET | ORAL | 0 refills | Status: DC

## 2017-05-30 MED ORDER — IPRATROPIUM-ALBUTEROL 0.5-2.5 (3) MG/3ML IN SOLN
3.0000 mL | Freq: Once | RESPIRATORY_TRACT | Status: AC
  Administered 2017-05-30: 3 mL via RESPIRATORY_TRACT
  Filled 2017-05-30: qty 3

## 2017-05-30 MED ORDER — SODIUM CHLORIDE 0.9 % IV BOLUS
1000.0000 mL | Freq: Once | INTRAVENOUS | Status: AC
  Administered 2017-05-30: 1000 mL via INTRAVENOUS

## 2017-05-30 NOTE — ED Notes (Signed)
Bed: WA22 Expected date:  Expected time:  Means of arrival:  Comments: EMS-respiratory distress 

## 2017-05-30 NOTE — Discharge Instructions (Signed)
You are found to have wheezing on exam.  We think you are having an asthma exacerbation.  We have given you a burst of prednisone to help decrease inflammation in your lungs.  Please take this in addition to using your inhaler every 4-6 hours as needed.  Please return with any increased respiratory distress or other concerns.

## 2017-05-30 NOTE — ED Triage Notes (Addendum)
Pt arrived via [facility] from home c/o shortness of breath x2 weeks. Pt has hx of asthma and has been using his inhaler multiple times/day per EMS. Pt reports productive cough with yellow sputum. RA O2 sat 96%. Pt given 125mg  solumedrol and two duoneb treatments by EMS.

## 2017-05-30 NOTE — ED Notes (Signed)
ED Provider at bedside. 

## 2017-05-30 NOTE — ED Notes (Signed)
Ambulated pt in hall and his O2 was 98%

## 2017-05-30 NOTE — ED Provider Notes (Signed)
[facility]-EMERGENCY DEPT Provider Note   CSN: [id number] Arrival date & time: 05/30/17  1043     History   Chief Complaint Chief Complaint  Patient presents with  . Respiratory Distress    HPI [name] is a 42 y.o. male.  HPI   Patient is a 42 year old male presenting with shortness of breath.  Patient has history of asthma.  Patient reports the last week has been increased cough.  He reports having albuterol but is not helped that much.  He reports feeling fevers but no measured.  Patient reports mild nausea occasional vomiting.  Past Medical History:  Diagnosis Date  . Asthma     Patient Active Problem List   Diagnosis Date Noted  . Severe recurrent major depression with psychotic features (HCC)   . Substance use disorder 04/09/2017  . Delusional disorder (HCC) 04/03/2017    Past Surgical History:  Procedure Laterality Date  . CHOLECYSTECTOMY          Home Medications    Prior to Admission medications   Medication Sig Start Date End Date Taking? Authorizing Provider  Albuterol Sulfate (VENTOLIN HFA IN) Inhale 2 puffs into the lungs. Every 4 to 6 hours as needed for wheezing or shortness of breath   Yes [provider]  hydrocortisone cream 1 % Apply 1 application topically 2 (two) times daily.   Yes [provider]  haloperidol (HALDOL) 5 MG tablet Take 1 tablet (5 mg total) by mouth 2 (two) times daily. Patient not taking: Reported on 05/30/2017 04/12/17   Charm RingsLord, Jamison Y, NP  predniSONE (DELTASONE) 20 MG tablet Day 1 and 2: Take 3 tabs  Day 3-5: Take 2 tabs.  Day 5-8: take 1 tab 05/30/17   Chelbi Herber Lyn, MD  sertraline (ZOLOFT) 50 MG tablet Take 1 tablet (50 mg total) by mouth daily. Patient not taking: Reported on 05/30/2017 04/13/17   Charm RingsLord, Jamison Y, NP  traZODone (DESYREL) 50 MG tablet Take 1 tablet (50 mg total) by mouth at bedtime and may repeat dose one time if needed. Patient not taking: Reported on  05/30/2017 04/12/17   Charm RingsLord, Jamison Y, NP    Family History No family history on file.  Social History Social History   Tobacco Use  . Smoking status: Former Games developermoker  . Smokeless tobacco: Never Used  Substance Use Topics  . Alcohol use: No    Frequency: Never  . Drug use: Yes    Types: Amphetamines, Cocaine     Allergies   Patient has no known allergies.   Review of Systems Review of Systems  Constitutional: Positive for fatigue and fever.  HENT: Positive for congestion.   Respiratory: Positive for chest tightness and shortness of breath.   All other systems reviewed and are negative.    Physical Exam Updated Vital Signs BP 129/84   Pulse 69   Resp 14   Ht 5\' 6"  (1.676 m)   Wt 81.6 kg (180 lb)   SpO2 100%   BMI 29.05 kg/m   Physical Exam  Constitutional: He is oriented to person, place, and time. He appears well-nourished.  HENT:  Head: Normocephalic.  Eyes: Conjunctivae are normal. Right eye exhibits no discharge. Left eye exhibits no discharge.  Cardiovascular: Normal rate and regular rhythm.  Pulmonary/Chest: He is in respiratory distress. He has wheezes.  Mild wheezing bilaterally.  Patient on nebulizer actively.  Mild increased work of breathing.  Mild tachypnea.    Neurological: He is oriented  to person, place, and time.  Skin: Skin is warm and dry. He is not diaphoretic.  Psychiatric: He has a normal mood and affect. His behavior is normal.     ED Treatments / Results  Labs (all labs ordered are listed, but only abnormal results are displayed) Labs Reviewed  CBC WITH DIFFERENTIAL/PLATELET - Abnormal; Notable for the following components:      Result Value   Eosinophils Absolute 1.2 (*)    All other components within normal limits  COMPREHENSIVE METABOLIC PANEL - Abnormal; Notable for the following components:   Glucose, Bld 127 (*)    All other components within normal limits    EKG None  Radiology Dg Chest 2 View  Result Date:  05/30/2017 CLINICAL DATA:  c/o shortness of breath x2 weeks. Pt has hx of asthma ; ex smoker EXAM: CHEST - 2 VIEW COMPARISON:  07/15/2007 FINDINGS: Relatively low lung volumes somewhat coarse perihilar and bibasilar bronchovascular markings. No confluent airspace disease or overt edema. Heart size and mediastinal contours are within normal limits. No effusion. Visualized bones unremarkable.  Surgical clips right upper abdomen. IMPRESSION: Low volumes.  No acute disease. Electronically Signed   By: Corlis Leak M.D.   On: 05/30/2017 12:11    Procedures Procedures (including critical care time)  Medications Ordered in ED Medications  sodium chloride 0.9 % bolus 1,000 mL (0 mLs Intravenous Stopped 05/30/17 1212)  ipratropium-albuterol (DUONEB) 0.5-2.5 (3) MG/3ML nebulizer solution 3 mL (3 mLs Nebulization Given 05/30/17 1227)     Initial Impression / Assessment and Plan / ED Course  I have reviewed the triage vital signs and the nursing notes.  Pertinent labs & imaging results that were available during my care of the patient were reviewed by me and considered in my medical decision making (see chart for details).       Patient is a 42 year old male presenting with shortness of breath.  Patient has history of asthma.  Patient reports the last week has been increased cough.  He reports having albuterol but is not helped that much.  He reports feeling fevers but no measured.  Patient reports mild nausea occasional vomiting.  11:37 AM Patient already already received Solu-Medrol and DuoNeb.  Will repeat another DuoNeb now.  We will give some fluids.  Suspect this is asthma exacerbation.  Will get x-ray to confirm that there is no infectious process.  3:57 PM Everything is been reassuring.  The patient's vital signs have remained normal.  He has not had any hypoxia or real tachypnea.  Patient is sleeping comfortably.  I had tech walk patient and patient was still 98% after walking around the  department.  Will treat patient with asthma exacerbation with steroids and have him follow-up with primary care.  Final Clinical Impressions(s) / ED Diagnoses   Final diagnoses:  Moderate asthma with exacerbation, unspecified whether persistent    ED Discharge Orders        Ordered    predniSONE (DELTASONE) 20 MG tablet     05/30/17 1556       Marshal Eskew Lyn, MD 05/30/17 1615

## 2019-05-11 ENCOUNTER — Emergency Department (HOSPITAL_COMMUNITY): Payer: Self-pay

## 2019-05-11 ENCOUNTER — Emergency Department (HOSPITAL_COMMUNITY)
Admission: EM | Admit: 2019-05-11 | Discharge: 2019-05-11 | Disposition: A | Discharge status: Home / Self Care | Payer: Self-pay | Attending: Emergency Medicine | Admitting: Emergency Medicine

## 2019-05-11 DIAGNOSIS — S8001XA Contusion of right knee, initial encounter: Secondary | ICD-10-CM | POA: Insufficient documentation

## 2019-05-11 DIAGNOSIS — R55 Syncope and collapse: Secondary | ICD-10-CM | POA: Insufficient documentation

## 2019-05-11 DIAGNOSIS — J45909 Unspecified asthma, uncomplicated: Secondary | ICD-10-CM | POA: Insufficient documentation

## 2019-05-11 DIAGNOSIS — Y9389 Activity, other specified: Secondary | ICD-10-CM | POA: Insufficient documentation

## 2019-05-11 DIAGNOSIS — W1789XA Other fall from one level to another, initial encounter: Secondary | ICD-10-CM | POA: Insufficient documentation

## 2019-05-11 DIAGNOSIS — M25562 Pain in left knee: Secondary | ICD-10-CM | POA: Insufficient documentation

## 2019-05-11 DIAGNOSIS — W19XXXA Unspecified fall, initial encounter: Secondary | ICD-10-CM

## 2019-05-11 DIAGNOSIS — Y999 Unspecified external cause status: Secondary | ICD-10-CM | POA: Insufficient documentation

## 2019-05-11 DIAGNOSIS — Y92009 Unspecified place in unspecified non-institutional (private) residence as the place of occurrence of the external cause: Secondary | ICD-10-CM | POA: Insufficient documentation

## 2019-05-11 DIAGNOSIS — M545 Low back pain: Secondary | ICD-10-CM | POA: Insufficient documentation

## 2019-05-11 MED ORDER — IBUPROFEN 800 MG PO TABS: 800.0000 mg | ORAL_TABLET | Freq: Three times a day (TID) | ORAL | 0 refills | Status: DC

## 2019-05-11 MED ORDER — HYDROCODONE-ACETAMINOPHEN 5-325 MG PO TABS: 1.0000 | ORAL_TABLET | ORAL | 0 refills | Status: DC | PRN

## 2019-05-11 NOTE — ED Triage Notes (Addendum)
Pt presents with c/o fall that occurred this morning at work. Pt reports he fell from approx 9 feet while he was on stilts. Pt denies hitting his head. Pt is able to answer all questions appropriately. Pt does report a positive LOC when he fell. Pt c/o tail bone pain and right leg pain. Pt reports his pain is in his left leg as well but mainly in the right leg.

## 2019-05-11 NOTE — ED Notes (Signed)
Patient currently in the restroom

## 2019-05-11 NOTE — ED Provider Notes (Signed)
[facility] DEPT Provider Note   CSN: [id number] Arrival date & time: 05/11/19  1315     History Chief Complaint  Patient presents with  . [name] is a 44 y.o. male.  Pt presents to the ED today with a fall.  The pt was up on stilts fixing a ceiling when he slipped and fell on his bottom.  He said his legs split apart.  He said he did not hit his head, but said he had a loc.  He is not on blood thinners.  Pt's main complaint is his low back and his right knee.  Pt declined an interpreter.  He said he feels like he understands and can communicate well enough.        Past Medical History:  Diagnosis Date  . Asthma     Patient Active Problem List   Diagnosis Date Noted  . Severe recurrent major depression with psychotic features ([place])   . Substance use disorder 04/09/2017  . Delusional disorder ([place]) 04/03/2017    Past Surgical History:  Procedure Laterality Date  . CHOLECYSTECTOMY         No family history on file.  Social History   Tobacco Use  . Smoking status: Former Research scientist (life sciences)  . Smokeless tobacco: Never Used  Substance Use Topics  . Alcohol use: No  . Drug use: Yes    Types: Amphetamines, Cocaine    Home Medications Prior to Admission medications   Medication Sig Start Date End Date Taking? Authorizing Provider  acetaminophen (TYLENOL) 500 MG tablet Take 1,000 mg by mouth daily as needed for mild pain.   Yes [provider]  haloperidol (HALDOL) 5 MG tablet Take 1 tablet (5 mg total) by mouth 2 (two) times daily. Patient not taking: Reported on 05/30/2017 04/12/17   Patrecia Pour, NP  HYDROcodone-acetaminophen (NORCO/VICODIN) 5-325 MG tablet Take 1 tablet by mouth every 4 (four) hours as needed. 05/11/19   Isla Pence, MD  ibuprofen (ADVIL) 800 MG tablet Take 1 tablet (800 mg total) by mouth 3 (three) times daily. 05/11/19   Isla Pence, MD  predniSONE (DELTASONE) 20 MG tablet Day 1 and 2:  Take 3 tabs  Day 3-5: Take 2 tabs.  Day 5-8: take 1 tab Patient not taking: Reported on 05/11/2019 05/30/17   Mackuen, Courteney Lyn, MD  sertraline (ZOLOFT) 50 MG tablet Take 1 tablet (50 mg total) by mouth daily. Patient not taking: Reported on 05/30/2017 04/13/17   Patrecia Pour, NP  traZODone (DESYREL) 50 MG tablet Take 1 tablet (50 mg total) by mouth at bedtime and may repeat dose one time if needed. Patient not taking: Reported on 05/30/2017 04/12/17   Patrecia Pour, NP    Allergies    Patient has no known allergies.  Review of Systems   Review of Systems  Musculoskeletal: Positive for back pain.       Right knee, left knee  All other systems reviewed and are negative.   Physical Exam Updated Vital Signs BP 131/84   Pulse 60   Temp 97.8 F (36.6 C) (Oral)   Resp 18   SpO2 100%   Physical Exam Vitals and nursing note reviewed.  Constitutional:      Appearance: Normal appearance.  HENT:     Head: Normocephalic and atraumatic.     Right Ear: External ear normal.     Left Ear: External ear normal.     Nose: Nose normal.  Mouth/Throat:     Mouth: Mucous membranes are moist.     Pharynx: Oropharynx is clear.  Eyes:     Extraocular Movements: Extraocular movements intact.     Conjunctiva/sclera: Conjunctivae normal.     Pupils: Pupils are equal, round, and reactive to light.  Cardiovascular:     Rate and Rhythm: Normal rate and regular rhythm.     Pulses: Normal pulses.     Heart sounds: Normal heart sounds.  Pulmonary:     Effort: Pulmonary effort is normal.     Breath sounds: Normal breath sounds.  Abdominal:     General: Abdomen is flat. Bowel sounds are normal.     Palpations: Abdomen is soft.  Musculoskeletal:        General: Normal range of motion.     Cervical back: Normal range of motion and neck supple.       Legs:  Skin:    General: Skin is warm.     Capillary Refill: Capillary refill takes less than 2 seconds.  Neurological:     General: No focal  deficit present.     Mental Status: He is alert and oriented to person, place, and time.  Psychiatric:        Mood and Affect: Mood normal.        Behavior: Behavior normal.     ED Results / Procedures / Treatments   Labs (all labs ordered are listed, but only abnormal results are displayed) Labs Reviewed - No data to display  EKG None  Radiology DG Chest 2 View  Result Date: 05/11/2019 CLINICAL DATA:  Fall EXAM: CHEST - 2 VIEW COMPARISON:  05/30/2017 FINDINGS: Streaky atelectasis or scarring at the bases. No consolidation or pleural effusion. Stable cardiomediastinal silhouette. No pneumothorax. IMPRESSION: No active cardiopulmonary disease. Electronically Signed   By: Jasmine Pang M.D.   On: 05/11/2019 17:25   DG Lumbar Spine Complete  Result Date: 05/11/2019 CLINICAL DATA:  Fall with back pain EXAM: LUMBAR SPINE - COMPLETE 4+ VIEW COMPARISON:  CT 11/23/2005 FINDINGS: Surgical clips in the right upper quadrant. Possible 5 mm right kidney stone. Lumbar alignment within normal limits. Minimal superior endplate deformities at T12, L2 and L3, probably chronic. Vertebral body heights are otherwise normal. Mild degenerative changes at L2-L3, L3-L4 and L4-L5. IMPRESSION: 1. Minimal superior endplate deformities at T12, L2 and L3, probably chronic. 2. There are degenerative changes throughout the lumbar spine. Electronically Signed   By: Jasmine Pang M.D.   On: 05/11/2019 17:20   DG Pelvis 1-2 Views  Result Date: 05/11/2019 CLINICAL DATA:  Larey Seat 9 feet tailbone pain EXAM: PELVIS - 1-2 VIEW COMPARISON:  None FINDINGS: SI joints are non widened. The pubic symphysis and rami are intact. Both femoral heads project in joint. No acute displaced fracture is seen. IMPRESSION: No acute osseous abnormality Electronically Signed   By: Jasmine Pang M.D.   On: 05/11/2019 17:18   DG Sacrum/Coccyx  Result Date: 05/11/2019 CLINICAL DATA:  Fall with tailbone pain EXAM: SACRUM AND COCCYX - 2+ VIEW  COMPARISON:  None. FINDINGS: There is no evidence of fracture or other focal bone lesions. IMPRESSION: Negative. Electronically Signed   By: Jasmine Pang M.D.   On: 05/11/2019 17:23   CT Head Wo Contrast  Result Date: 05/11/2019 CLINICAL DATA:  Headache fall EXAM: CT HEAD WITHOUT CONTRAST TECHNIQUE: Contiguous axial images were obtained from the base of the skull through the vertex without intravenous contrast. COMPARISON:  None. FINDINGS: Brain: No evidence of  acute infarction, hemorrhage, hydrocephalus, extra-axial collection or mass lesion/mass effect. Vascular: No hyperdense vessel or unexpected calcification. Skull: Normal. Negative for fracture or focal lesion. Sinuses/Orbits: Mucosal thickening in the maxillary, ethmoid and sphenoid sinuses Other: None IMPRESSION: Negative non contrasted CT appearance of the brain. Electronically Signed   By: Jasmine Pang M.D.   On: 05/11/2019 17:55   DG Knee Complete 4 Views Left  Result Date: 05/11/2019 CLINICAL DATA:  Fall EXAM: LEFT KNEE - COMPLETE 4+ VIEW COMPARISON:  None. FINDINGS: No evidence of fracture, dislocation, or joint effusion. No evidence of arthropathy or other focal bone abnormality. Soft tissues are unremarkable. IMPRESSION: Negative. Electronically Signed   By: Jasmine Pang M.D.   On: 05/11/2019 17:21   DG Knee Complete 4 Views Right  Result Date: 05/11/2019 CLINICAL DATA:  Fall EXAM: RIGHT KNEE - COMPLETE 4+ VIEW COMPARISON:  None. FINDINGS: No evidence of fracture, dislocation, or joint effusion. No evidence of arthropathy or other focal bone abnormality. Soft tissues are unremarkable. IMPRESSION: Negative. Electronically Signed   By: Jasmine Pang M.D.   On: 05/11/2019 17:22    Procedures Procedures (including critical care time)  Medications Ordered in ED Medications - No data to display  ED Course  I have reviewed the triage vital signs and the nursing notes.  Pertinent labs & imaging results that were available during my  care of the patient were reviewed by me and considered in my medical decision making (see chart for details).    MDM Rules/Calculators/A&P                      Pt does not have any fractures.  His right knee is the most painful, so he is given a knee sleeve and crutches.  He is told to f/u with ortho.  Return if worse.   Final Clinical Impression(s) / ED Diagnoses Final diagnoses:  Fall, initial encounter  Contusion of right knee, initial encounter    Rx / DC Orders ED Discharge Orders         Ordered    HYDROcodone-acetaminophen (NORCO/VICODIN) 5-325 MG tablet  Every 4 hours PRN     05/11/19 1808    ibuprofen (ADVIL) 800 MG tablet  3 times daily     05/11/19 1808           Jacalyn Lefevre, MD 05/11/19 1810

## 2019-05-11 NOTE — ED Notes (Signed)
An After Visit Summary was printed and given to the patient. Discharge instructions given and no further questions at this time. Pt able to stand and turn to sit in wheelchair- pt states his brother is coming to pick him up.

## 2019-10-05 ENCOUNTER — Other Ambulatory Visit: Payer: Self-pay

## 2019-10-05 ENCOUNTER — Encounter (HOSPITAL_COMMUNITY): Payer: Self-pay | Admitting: Emergency Medicine

## 2019-10-05 ENCOUNTER — Ambulatory Visit (HOSPITAL_COMMUNITY)
Admission: EM | Admit: 2019-10-05 | Discharge: 2019-10-05 | Disposition: A | Discharge status: Home / Self Care | Payer: Self-pay | Attending: Urgent Care | Admitting: Urgent Care

## 2019-10-05 DIAGNOSIS — R21 Rash and other nonspecific skin eruption: Secondary | ICD-10-CM

## 2019-10-05 DIAGNOSIS — G8929 Other chronic pain: Secondary | ICD-10-CM

## 2019-10-05 DIAGNOSIS — F1290 Cannabis use, unspecified, uncomplicated: Secondary | ICD-10-CM

## 2019-10-05 DIAGNOSIS — M546 Pain in thoracic spine: Secondary | ICD-10-CM

## 2019-10-05 DIAGNOSIS — R200 Anesthesia of skin: Secondary | ICD-10-CM

## 2019-10-05 DIAGNOSIS — L089 Local infection of the skin and subcutaneous tissue, unspecified: Secondary | ICD-10-CM

## 2019-10-05 DIAGNOSIS — S20419A Abrasion of unspecified back wall of thorax, initial encounter: Secondary | ICD-10-CM

## 2019-10-05 DIAGNOSIS — M25562 Pain in left knee: Secondary | ICD-10-CM

## 2019-10-05 MED ORDER — NAPROXEN 500 MG PO TABS: 500.0000 mg | ORAL_TABLET | Freq: Two times a day (BID) | ORAL | 0 refills | Status: AC

## 2019-10-05 MED ORDER — CEPHALEXIN 500 MG PO CAPS: 500.0000 mg | ORAL_CAPSULE | Freq: Three times a day (TID) | ORAL | 0 refills | Status: AC

## 2019-10-05 MED ORDER — HYDROXYZINE HCL 25 MG PO TABS
25.0000 mg | ORAL_TABLET | Freq: Four times a day (QID) | ORAL | 0 refills | Status: AC
Start: 2019-10-05 — End: ?

## 2019-10-05 NOTE — ED Provider Notes (Signed)
[facility]   MRN: [id number] DOB: [birth year]  Subjective:   [name] is a 44 y.o. male presenting for multiple complaints.  Reports that he has had left-sided scalp numbness over the temporal surface of his scalp for the past few days.  Has also had acute on chronic back pain.  Thinks that this is related to cupping he had done yesterday.  States that he feels like they burn his skin.  Started to have different spots show up on his torso that are painful and red.  This includes area on the front that he did not have the cupping procedure done.  Has also had persistent bilateral knee pain from a fall he had about a month ago.  Had negative x-rays.  States that he has some itching along his forearms from sitting next to tree debris last week.  Denies headache, confusion, vision change, one-sided weakness, chest pain, shortness of breath, abdominal pain.  Patient smokes marijuana daily.  States that he does not use any other drugs.  Does not drink alcohol.  Of note, patient performs strenuous work activities, has not rested from his work.  Denies taking chronic medications.  No Known Allergies  Past Medical History:  Diagnosis Date  . Asthma      Past Surgical History:  Procedure Laterality Date  . CHOLECYSTECTOMY      History reviewed. No pertinent family history.  Social History   Tobacco Use  . Smoking status: Former Games developer  . Smokeless tobacco: Never Used  Substance Use Topics  . Alcohol use: No  . Drug use: Yes    Types: Amphetamines, Cocaine    ROS   Objective:   Vitals: BP (!) 131/93 (BP Location: Right Arm)   Pulse 89   Temp 98.4 F (36.9 C) (Oral)   Resp 18   SpO2 96%   Physical Exam Constitutional:      General: He is not in acute distress.    Appearance: Normal appearance. He is well-developed. He is not ill-appearing, toxic-appearing or diaphoretic.  HENT:     Head: Normocephalic and atraumatic.     Right Ear: External ear normal.       Left Ear: External ear normal.     Nose: Nose normal.     Mouth/Throat:     Mouth: Mucous membranes are moist.     Pharynx: Oropharynx is clear.  Eyes:     General: No scleral icterus.       Right eye: No discharge.        Left eye: No discharge.     Extraocular Movements: Extraocular movements intact.     Conjunctiva/sclera: Conjunctivae normal.     Pupils: Pupils are equal, round, and reactive to light.  Cardiovascular:     Rate and Rhythm: Normal rate and regular rhythm.     Heart sounds: Normal heart sounds. No murmur heard.  No friction rub. No gallop.   Pulmonary:     Effort: Pulmonary effort is normal. No respiratory distress.     Breath sounds: Normal breath sounds. No stridor. No wheezing, rhonchi or rales.  Abdominal:     General: Bowel sounds are normal. There is no distension.     Palpations: Abdomen is soft. There is no mass.     Tenderness: There is no abdominal tenderness. There is no right CVA tenderness, left CVA tenderness, guarding or rebound.  Musculoskeletal:     Right knee: No swelling, deformity, effusion, erythema, ecchymosis, lacerations, bony tenderness or  crepitus. Normal range of motion. Tenderness (over posterior lateral part of knee) present. No medial joint line, lateral joint line or patellar tendon tenderness. Normal alignment and normal patellar mobility.     Left knee: No swelling, deformity, effusion, erythema, ecchymosis, lacerations, bony tenderness or crepitus. Normal range of motion. Tenderness (over posterior lateral part of knee) present. No medial joint line, lateral joint line or patellar tendon tenderness. Normal alignment and normal patellar mobility.     Right lower leg: No edema.     Left lower leg: No edema.  Skin:    General: Skin is warm and dry.       Neurological:     Mental Status: He is alert and oriented to person, place, and time.     Cranial Nerves: No cranial nerve deficit.     Motor: No weakness.     Coordination:  Coordination normal.     Gait: Gait normal.     Deep Tendon Reflexes: Reflexes normal.     Comments: Negative Romberg and pronator drift.  Psychiatric:        Mood and Affect: Mood normal.        Behavior: Behavior normal.        Thought Content: Thought content normal.        Judgment: Judgment normal.      Assessment and Plan :   PDMP not reviewed this encounter.  1. Superficial skin infection   2. Chronic low back pain without sciatica, unspecified back pain laterality   3. Acute bilateral thoracic back pain   4. Rash and nonspecific skin eruption   5. Left sided numbness   6. Marijuana use   7. Abrasion of back, unspecified laterality, initial encounter   8. Acute bilateral knee pain     Will cover for superficial skin infection with Keflex.  Use naproxen for multiple pains, aches.  Recommended hydroxyzine for itching.  At this time patient has normal neurologic exam, no sign of ACS or intracranial process.  Do not suspect that patient is in need of a CT scan of the head/brain now.  Counseled patient on potential for adverse effects with medications prescribed/recommended today, strict ER and return-to-clinic precautions discussed, patient verbalized understanding.    Wallis Bamberg, PA-C 10/05/19 1213

## 2019-10-05 NOTE — ED Triage Notes (Signed)
Pt presents to [facility] for assessment of back pain, abdominal pain, and left head numbness.  Patient states there is a spot on the left side of his head/scalp that feels weird and numb.  Patient states he had a fall in the past month, and had cupping done yesterday, and pain is much worse today.  Neuro assessment at triage WNL

## 2020-03-20 ENCOUNTER — Encounter (HOSPITAL_COMMUNITY): Payer: Self-pay

## 2020-03-20 ENCOUNTER — Ambulatory Visit (INDEPENDENT_AMBULATORY_CARE_PROVIDER_SITE_OTHER): Payer: HRSA Program

## 2020-03-20 ENCOUNTER — Other Ambulatory Visit: Payer: Self-pay

## 2020-03-20 ENCOUNTER — Ambulatory Visit (HOSPITAL_COMMUNITY)
Admission: EM | Admit: 2020-03-20 | Discharge: 2020-03-20 | Disposition: A | Discharge status: Home / Self Care | Payer: HRSA Program | Attending: Family Medicine | Admitting: Family Medicine

## 2020-03-20 DIAGNOSIS — R0602 Shortness of breath: Secondary | ICD-10-CM | POA: Diagnosis present

## 2020-03-20 DIAGNOSIS — J1282 Pneumonia due to coronavirus disease 2019: Secondary | ICD-10-CM | POA: Diagnosis present

## 2020-03-20 DIAGNOSIS — J4521 Mild intermittent asthma with (acute) exacerbation: Secondary | ICD-10-CM | POA: Diagnosis present

## 2020-03-20 DIAGNOSIS — U071 COVID-19: Secondary | ICD-10-CM | POA: Diagnosis not present

## 2020-03-20 DIAGNOSIS — R509 Fever, unspecified: Secondary | ICD-10-CM

## 2020-03-20 DIAGNOSIS — R059 Cough, unspecified: Secondary | ICD-10-CM

## 2020-03-20 DIAGNOSIS — R0789 Other chest pain: Secondary | ICD-10-CM

## 2020-03-20 LAB — SARS CORONAVIRUS 2 (TAT 6-24 HRS): SARS Coronavirus 2: POSITIVE — AB

## 2020-03-20 MED ORDER — AZITHROMYCIN 250 MG PO TABS
ORAL_TABLET | ORAL | 0 refills | Status: DC
Start: 2020-03-20 — End: 2020-03-20

## 2020-03-20 MED ORDER — METHYLPREDNISOLONE SODIUM SUCC 125 MG IJ SOLR
INTRAMUSCULAR | Status: AC
  Filled 2020-03-20: qty 2

## 2020-03-20 MED ORDER — AZITHROMYCIN 250 MG PO TABS: ORAL_TABLET | ORAL | 0 refills | Status: AC

## 2020-03-20 MED ORDER — ALBUTEROL SULFATE HFA 108 (90 BASE) MCG/ACT IN AERS: 1.0000 | INHALATION_SPRAY | Freq: Four times a day (QID) | RESPIRATORY_TRACT | 0 refills | Status: DC | PRN

## 2020-03-20 MED ORDER — PREDNISONE 50 MG PO TABS
ORAL_TABLET | ORAL | 0 refills | Status: AC
Start: 2020-03-20 — End: ?

## 2020-03-20 MED ORDER — METHYLPREDNISOLONE SODIUM SUCC 125 MG IJ SOLR
60.0000 mg | Freq: Once | INTRAMUSCULAR | Status: AC
  Administered 2020-03-20: 60 mg via INTRAMUSCULAR

## 2020-03-20 MED ORDER — ALBUTEROL SULFATE HFA 108 (90 BASE) MCG/ACT IN AERS: 1.0000 | INHALATION_SPRAY | Freq: Four times a day (QID) | RESPIRATORY_TRACT | 0 refills | Status: AC | PRN

## 2020-03-20 MED ORDER — DEXAMETHASONE SODIUM PHOSPHATE 10 MG/ML IJ SOLN: 10.0000 mg | Freq: Once | INTRAMUSCULAR | Status: DC

## 2020-03-20 NOTE — ED Provider Notes (Signed)
[facility]    CSN: [id number] Arrival date & time: 03/20/20  8563      History   Chief Complaint Chief Complaint  Patient presents with  . Cough  . Chest Pain  . Shortness of Breath  . Sore Throat  . Fatigue  . Chills    HPI [name] is a 45 y.o. male.   Here today with 3 day history of cough, chest tightness, SOB, chills, sweats, headaches, nausea. The SOB has been worsening over time. He also now has itching across body after using some OTC cold medication and thinks he's having a reaction to it. Denies palpitations, dizziness, syncope, extremity numbness or tingling, known fever. Not trying anything OTC at this time for sxs. Hx of asthma, has albuterol at home, used once or twice with some relief. No known sick contacts. Not UTD on COVID vaccine.      Past Medical History:  Diagnosis Date  . Asthma     Patient Active Problem List   Diagnosis Date Noted  . Severe recurrent major depression with psychotic features (HCC)   . Substance use disorder 04/09/2017  . Delusional disorder (HCC) 04/03/2017    Past Surgical History:  Procedure Laterality Date  . CHOLECYSTECTOMY         Home Medications    Prior to Admission medications   Medication Sig Start Date End Date Taking? Authorizing Provider  predniSONE (DELTASONE) 50 MG tablet Take 1 tab daily with breakfast for 5 days 03/20/20  Yes Particia Nearing, PA-C  acetaminophen (TYLENOL) 500 MG tablet Take 1,000 mg by mouth daily as needed for mild pain.    [provider]  albuterol (VENTOLIN HFA) 108 (90 Base) MCG/ACT inhaler Inhale 1-2 puffs into the lungs every 6 (six) hours as needed for wheezing or shortness of breath. 03/20/20   Particia Nearing, PA-C  azithromycin [facility]) 250 MG tablet Take 2 tabs day one, then 1 tab daily until complete 03/20/20   Particia Nearing, PA-C  cephALEXin (KEFLEX) 500 MG capsule Take 1 capsule (500 mg total) by mouth 3 (three) times  daily. 10/05/19   Wallis Bamberg, PA-C  hydrOXYzine (ATARAX/VISTARIL) 25 MG tablet Take 1 tablet (25 mg total) by mouth every 6 (six) hours. 10/05/19   Wallis Bamberg, PA-C  naproxen (NAPROSYN) 500 MG tablet Take 1 tablet (500 mg total) by mouth 2 (two) times daily with a meal. 10/05/19   Wallis Bamberg, PA-C  haloperidol (HALDOL) 5 MG tablet Take 1 tablet (5 mg total) by mouth 2 (two) times daily. Patient not taking: Reported on 05/30/2017 04/12/17 10/05/19  Charm Rings, NP  sertraline (ZOLOFT) 50 MG tablet Take 1 tablet (50 mg total) by mouth daily. Patient not taking: Reported on 05/30/2017 04/13/17 10/05/19  Charm Rings, NP  traZODone (DESYREL) 50 MG tablet Take 1 tablet (50 mg total) by mouth at bedtime and may repeat dose one time if needed. Patient not taking: Reported on 05/30/2017 04/12/17 10/05/19  Charm Rings, NP    Family History History reviewed. No pertinent family history.  Social History Social History   Tobacco Use  . Smoking status: Former Games developer  . Smokeless tobacco: Never Used  Substance Use Topics  . Alcohol use: No  . Drug use: Yes    Types: Amphetamines, Cocaine     Allergies   Patient has no known allergies.   Review of Systems Review of Systems PER HPI    Physical Exam Triage Vital Signs  ED Triage Vitals  Enc Vitals Group     BP 03/20/20 0925 132/90     Pulse Rate 03/20/20 0925 86     Resp 03/20/20 0925 15     Temp 03/20/20 0925 98.5 F (36.9 C)     Temp Source 03/20/20 0925 Oral     SpO2 03/20/20 0925 92 %     Weight --      Height --      Head Circumference --      Peak Flow --      Pain Score 03/20/20 0923 8     Pain Loc --      Pain Edu? --      Excl. in GC? --    No data found.  Updated Vital Signs BP 132/90 (BP Location: Left Arm)   Pulse 86   Temp 98.5 F (36.9 C) (Oral)   Resp 15   SpO2 92%   Visual Acuity Right Eye Distance:   Left Eye Distance:   Bilateral Distance:    Right Eye Near:   Left Eye Near:    Bilateral  Near:     Physical Exam Vitals and nursing note reviewed.  Constitutional:      Appearance: He is ill-appearing.  HENT:     Head: Atraumatic.     Nose: Rhinorrhea present.     Mouth/Throat:     Pharynx: Posterior oropharyngeal erythema present. No oropharyngeal exudate.  Eyes:     Extraocular Movements: Extraocular movements intact.     Conjunctiva/sclera: Conjunctivae normal.  Cardiovascular:     Rate and Rhythm: Normal rate and regular rhythm.  Pulmonary:     Effort: Pulmonary effort is normal. No respiratory distress.     Breath sounds: No wheezing or rales.     Comments: Breath sounds mildly decreased throughout  Musculoskeletal:        General: Normal range of motion.     Cervical back: Normal range of motion and neck supple.  Skin:    General: Skin is warm and dry.  Neurological:     General: No focal deficit present.     Mental Status: He is alert and oriented to person, place, and time.  Psychiatric:        Mood and Affect: Mood normal.        Thought Content: Thought content normal.        Judgment: Judgment normal.      UC Treatments / Results  Labs (all labs ordered are listed, but only abnormal results are displayed) Labs Reviewed  SARS CORONAVIRUS 2 (TAT 6-24 HRS) - Abnormal; Notable for the following components:      Result Value   SARS Coronavirus 2 POSITIVE (*)    All other components within normal limits    EKG   Radiology DG Chest 2 View  Result Date: 03/20/2020 CLINICAL DATA:  Three days worsening cough, shortness of breath, chest tightness. Additional history provided: Patient reports cough, fever, stomach pain, chills, body aches, headaches, symptoms for 3 days. EXAM: CHEST - 2 VIEW COMPARISON:  Prior chest radiograph 05/11/2019 and earlier. FINDINGS: Heart size within normal limits. Fairly extensive patchy bilateral pulmonary opacities. No evidence of pleural effusion or pneumothorax. No acute bony abnormality identified. Surgical clips  within the right upper quadrant of the abdomen. IMPRESSION: Fairly extensive patchy bilateral pulmonary opacities likely reflecting multifocal pneumonia given the provided history. Consider atypical/viral causes (i.e. COVID 19 infection). Electronically Signed   By: Jackey Loge DO  On: 03/20/2020 09:50    Procedures Procedures (including critical care time)  Medications Ordered in UC Medications  methylPREDNISolone sodium succinate (SOLU-MEDROL) 125 mg/2 mL injection 60 mg (60 mg Intramuscular Given 03/20/20 0955)    Initial Impression / Assessment and Plan / UC Course  I have reviewed the triage vital signs and the nursing notes.  Pertinent labs & imaging results that were available during my care of the patient were reviewed by me and considered in my medical decision making (see chart for details).     O2 saturation at 92% on room air and patient very anxious, mildly SOB in room. Discussed CXR showing mutlifocal likely COVID pneumonia, COVID pcr pending though expect it to be positive. Solumedrol given in clinic, sent home on prednisone burst, albuterol inhaler, and zithromax to cover for bacterial infection given extent. Strict ED precautions for any worsening sxs. Work note refused. Patient agreeable to plan today.   Final Clinical Impressions(s) / UC Diagnoses   Final diagnoses:  Pneumonia due to COVID-19 virus  SOB (shortness of breath)  Mild intermittent asthma with acute exacerbation     Discharge Instructions     Go directly to the ER if your breathing becomes worse at any time    ED Prescriptions    Medication Sig Dispense Auth. Provider   predniSONE (DELTASONE) 50 MG tablet Take 1 tab daily with breakfast for 5 days 5 tablet Particia Nearing, PA-C   albuterol (VENTOLIN HFA) 108 (90 Base) MCG/ACT inhaler  (Status: Discontinued) Inhale 1-2 puffs into the lungs every 6 (six) hours as needed for wheezing or shortness of breath. 18 g Particia Nearing, PA-C    azithromycin (ZITHROMAX) 250 MG tablet  (Status: Discontinued) Take 2 tabs day one, then 1 tab daily until complete 6 tablet Particia Nearing, PA-C   albuterol (VENTOLIN HFA) 108 (90 Base) MCG/ACT inhaler Inhale 1-2 puffs into the lungs every 6 (six) hours as needed for wheezing or shortness of breath. 18 g Particia Nearing, PA-C   azithromycin (ZITHROMAX) 250 MG tablet Take 2 tabs day one, then 1 tab daily until complete 6 tablet Particia Nearing, [place]     PDMP not reviewed this encounter.   Particia Nearing, [place] 03/20/20 1930

## 2020-03-20 NOTE — ED Notes (Signed)
Reported O2 level of 92% to Roosvelt Maser, [place].

## 2020-03-20 NOTE — Discharge Instructions (Addendum)
Go directly to the ER if your breathing becomes worse at any time

## 2020-03-20 NOTE — ED Triage Notes (Signed)
Pt c/o cough, fever, stomach pain, chills, body aches, headaches X 3 days. Pt states he has asthma and states he has been feeling SOB and having chest pain X 3 weeks. Pt states he has been feeling itchiness all over his body and fatigue.

## 2020-03-21 ENCOUNTER — Telehealth: Payer: Self-pay | Admitting: Nurse Practitioner

## 2020-03-21 NOTE — Telephone Encounter (Signed)
Called to Discuss with patient about Covid symptoms and the use of the monoclonal antibody infusion for those with mild to moderate Covid symptoms and at a high risk of hospitalization.     Pt appears to qualify for this infusion due to co-morbid conditions and/or a member of an at-risk group in accordance with the FDA Emergency Use Authorization.    Unable to reach pt with use of interpretor. Voicemail left.   Symptom onset: 03/17/20 Vaccinated: No  Qualified for Infusion: Yes   Willette Alma, NP [facility]  [phone]

## 2020-03-23 ENCOUNTER — Ambulatory Visit (HOSPITAL_COMMUNITY)
Admission: EM | Admit: 2020-03-23 | Discharge: 2020-03-23 | Disposition: A | Discharge status: Home / Self Care | Payer: Self-pay | Attending: Emergency Medicine | Admitting: Emergency Medicine

## 2020-03-23 ENCOUNTER — Encounter (HOSPITAL_COMMUNITY): Payer: Self-pay

## 2020-03-23 DIAGNOSIS — U071 COVID-19: Secondary | ICD-10-CM

## 2020-03-23 MED ORDER — ACETAMINOPHEN 500 MG PO TABS: 1000.0000 mg | ORAL_TABLET | Freq: Four times a day (QID) | ORAL | 0 refills | Status: AC | PRN

## 2020-03-23 MED ORDER — GUAIFENESIN ER 600 MG PO TB12
1200.0000 mg | ORAL_TABLET | Freq: Two times a day (BID) | ORAL | 0 refills | Status: AC | PRN
Start: 2020-03-23 — End: 2020-03-28

## 2020-03-23 NOTE — ED Provider Notes (Signed)
[facility]    CSN: [id number] Arrival date & time: 03/23/20  0944      History   Chief Complaint Chief Complaint  Patient presents with  . Follow-up    HPI [name] is a 45 y.o. male.   [name] presents with complaints of persistent shortness of breath, fevers/ chills/sweats, feeling "out of it", cough, and headache. He was seen here 1/24 with a three day history of symptoms, with chest xray consistent with covid-19 pneumonia and covid-19 testing positive. Today he shares that he has had weeks of symptoms? He walked here today, stating it took about 1.5 hours. He is able to take the bus back home. States he had previous felt dehydrated but feels this is improved. No chest pain . History of asthma. Was provided azithromycin, prednisone and an inhaler at last visit.    ROS per HPI, negative if not otherwise mentioned.      Past Medical History:  Diagnosis Date  . Asthma     Patient Active Problem List   Diagnosis Date Noted  . Severe recurrent major depression with psychotic features (HCC)   . Substance use disorder 04/09/2017  . Delusional disorder (HCC) 04/03/2017    Past Surgical History:  Procedure Laterality Date  . CHOLECYSTECTOMY         Home Medications    Prior to Admission medications   Medication Sig Start Date End Date Taking? Authorizing Provider  guaiFENesin (MUCINEX) 600 MG 12 hr tablet Take 2 tablets (1,200 mg total) by mouth 2 (two) times daily as needed for up to 5 days. 03/23/20 03/28/20 Yes Helayna Dun, Barron Alvine, NP  acetaminophen (TYLENOL) 500 MG tablet Take 2 tablets (1,000 mg total) by mouth every 6 (six) hours as needed for mild pain. 03/23/20   Georgetta Haber, NP  albuterol (VENTOLIN HFA) 108 (90 Base) MCG/ACT inhaler Inhale 1-2 puffs into the lungs every 6 (six) hours as needed for wheezing or shortness of breath. 03/20/20   Particia Nearing, PA-C  azithromycin [facility]) 250 MG tablet Take 2 tabs day  one, then 1 tab daily until complete 03/20/20   Particia Nearing, PA-C  cephALEXin (KEFLEX) 500 MG capsule Take 1 capsule (500 mg total) by mouth 3 (three) times daily. 10/05/19   Wallis Bamberg, PA-C  hydrOXYzine (ATARAX/VISTARIL) 25 MG tablet Take 1 tablet (25 mg total) by mouth every 6 (six) hours. 10/05/19   Wallis Bamberg, PA-C  naproxen (NAPROSYN) 500 MG tablet Take 1 tablet (500 mg total) by mouth 2 (two) times daily with a meal. 10/05/19   Wallis Bamberg, PA-C  predniSONE (DELTASONE) 50 MG tablet Take 1 tab daily with breakfast for 5 days 03/20/20   Particia Nearing, PA-C  haloperidol (HALDOL) 5 MG tablet Take 1 tablet (5 mg total) by mouth 2 (two) times daily. Patient not taking: Reported on 05/30/2017 04/12/17 10/05/19  Charm Rings, NP  sertraline (ZOLOFT) 50 MG tablet Take 1 tablet (50 mg total) by mouth daily. Patient not taking: Reported on 05/30/2017 04/13/17 10/05/19  Charm Rings, NP  traZODone (DESYREL) 50 MG tablet Take 1 tablet (50 mg total) by mouth at bedtime and may repeat dose one time if needed. Patient not taking: Reported on 05/30/2017 04/12/17 10/05/19  Charm Rings, NP    Family History History reviewed. No pertinent family history.  Social History Social History   Tobacco Use  . Smoking status: Former Games developer  . Smokeless tobacco: Never Used  Substance  Use Topics  . Alcohol use: No  . Drug use: Yes    Types: Amphetamines, Cocaine     Allergies   Patient has no known allergies.   Review of Systems Review of Systems   Physical Exam Triage Vital Signs ED Triage Vitals  Enc Vitals Group     BP 03/23/20 0948 115/81     Pulse Rate 03/23/20 0948 88     Resp --      Temp --      Temp src --      SpO2 03/23/20 1039 95 %     Weight 03/23/20 0958 204 lb (92.5 kg)     Height --      Head Circumference --      Peak Flow --      Pain Score 03/23/20 0958 0     Pain Loc --      Pain Edu? --      Excl. in GC? --    No data found.  Updated Vital  Signs BP 115/81   Pulse 88   Wt 204 lb (92.5 kg)   SpO2 95%   BMI 32.93 kg/m   Visual Acuity Right Eye Distance:   Left Eye Distance:   Bilateral Distance:    Right Eye Near:   Left Eye Near:    Bilateral Near:     Physical Exam Constitutional:      Appearance: He is well-developed.  Cardiovascular:     Rate and Rhythm: Normal rate.  Pulmonary:     Effort: Pulmonary effort is normal.     Comments: No work of breathing or tachypnea on my exam; per nursing staff patient was tachypneic on arrival, having walked to the clinic  Musculoskeletal:     Cervical back: Normal range of motion.  Skin:    General: Skin is warm and dry.  Neurological:     Mental Status: He is alert and oriented to person, place, and time.      UC Treatments / Results  Labs (all labs ordered are listed, but only abnormal results are displayed) Labs Reviewed - No data to display  EKG   Radiology No results found.  Procedures Procedures (including critical care time)  Medications Ordered in UC Medications - No data to display  Initial Impression / Assessment and Plan / UC Course  I have reviewed the triage vital signs and the nursing notes.  Pertinent labs & imaging results that were available during my care of the patient were reviewed by me and considered in my medical decision making (see chart for details).     covid 19 pneumonia. Vitals stable. No work of breathing. No chest pain . No hypoxia today. Feels improved but still with symptoms and has questions about management and expected course of illness. Tylenol recommended as he has not been using this, mucinex as an expectorant as well. Return precautions provided. Patient verbalized understanding and agreeable to plan.   Final Clinical Impressions(s) / UC Diagnoses   Final diagnoses:  COVID-19 virus infection     Discharge Instructions     Push fluids to ensure adequate hydration and keep secretions thin.  Tylenol every 6  hours as needed for pain or fevers.  Mucinex every 12 hours to help with mucus production.  You may try some vitamins to help your immune system potentially:  Vitamin C 500mg  twice a day. Zinc 50mg  daily. Vitamin D 5000IU daily.   Continue with previously prescribed medications.  Follow up  with post-covid care clinic as needed.  Go to the ER for any worsening of symptoms- particularly shortness of breath or chest pain .    ED Prescriptions    Medication Sig Dispense Auth. Provider   acetaminophen (TYLENOL) 500 MG tablet Take 2 tablets (1,000 mg total) by mouth every 6 (six) hours as needed for mild pain. 60 tablet Linus Mako B, NP   guaiFENesin (MUCINEX) 600 MG 12 hr tablet Take 2 tablets (1,200 mg total) by mouth 2 (two) times daily as needed for up to 5 days. 20 tablet Georgetta Haber, NP     PDMP not reviewed this encounter.   Georgetta Haber, NP 03/23/20 1058

## 2020-03-23 NOTE — Discharge Instructions (Signed)
Push fluids to ensure adequate hydration and keep secretions thin.  Tylenol every 6 hours as needed for pain or fevers.  Mucinex every 12 hours to help with mucus production.  You may try some vitamins to help your immune system potentially:  Vitamin C 500mg  twice a day. Zinc 50mg  daily. Vitamin D 5000IU daily.   Continue with previously prescribed medications.  Follow up with post-covid care clinic as needed.  Go to the ER for any worsening of symptoms- particularly shortness of breath or chest pain .

## 2020-03-23 NOTE — ED Triage Notes (Addendum)
Patient presents to Urgent Care with complaints of SOB, fever, cough.  He was seen 01/24 covid positive. Here for a follow-up continues to feel short of breath, fever, and cough.
